# Patient Record
Sex: Male | Born: 1955
Health system: Southern US, Community
[De-identification: ages and names within clinical notes are randomized; demographics above are authoritative.]

## PROBLEM LIST (undated history)

## (undated) DIAGNOSIS — S62102A Fracture of unspecified carpal bone, left wrist, initial encounter for closed fracture: Secondary | ICD-10-CM

## (undated) DIAGNOSIS — F4310 Post-traumatic stress disorder, unspecified: Secondary | ICD-10-CM

## (undated) DIAGNOSIS — I10 Essential (primary) hypertension: Secondary | ICD-10-CM

## (undated) DIAGNOSIS — H547 Unspecified visual loss: Secondary | ICD-10-CM

## (undated) DIAGNOSIS — K219 Gastro-esophageal reflux disease without esophagitis: Secondary | ICD-10-CM

## (undated) DIAGNOSIS — F40231 Fear of injections and transfusions: Secondary | ICD-10-CM

## (undated) HISTORY — PX: OTHER SURGICAL HISTORY: SHX169

---

## 2007-11-01 ENCOUNTER — Encounter: Admission: RE | Admit: 2007-11-01 | Discharge: 2007-11-01 | Payer: Self-pay | Admitting: Internal Medicine

## 2008-08-12 IMAGING — CR DG FOOT COMPLETE 3+V*R*
3 series · 3 of 3 positions shown · non-contrast
Comparison: None available

CLINICAL DATA: Status post fall 2 months ago.

RIGHT FOOT COMPLETE - 3+ VIEW

[view not recorded (1 of 3)]
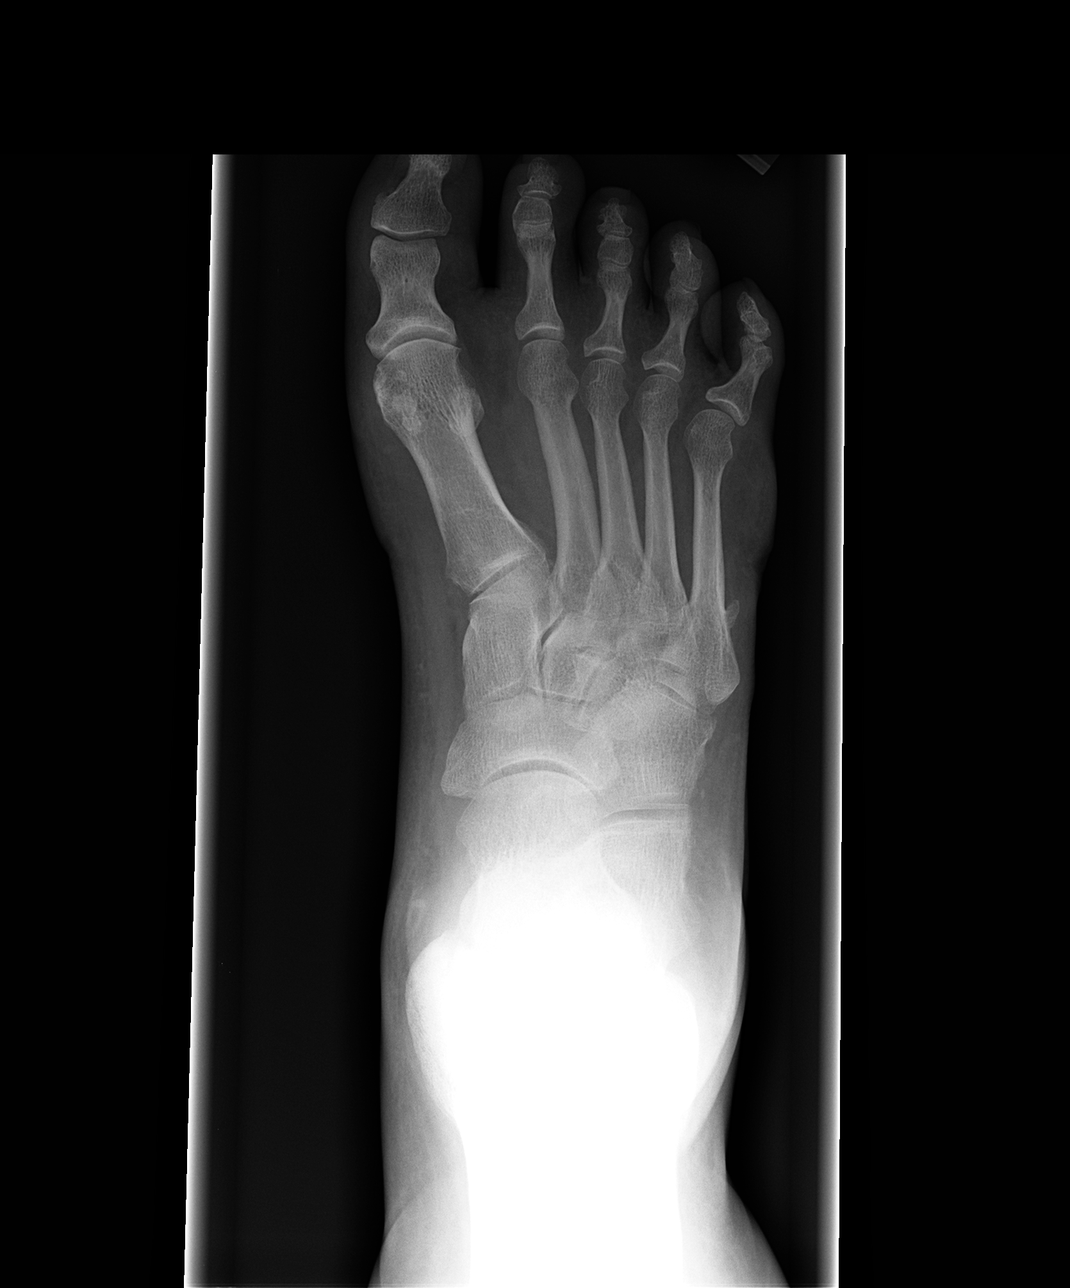

[view not recorded (2 of 3)]
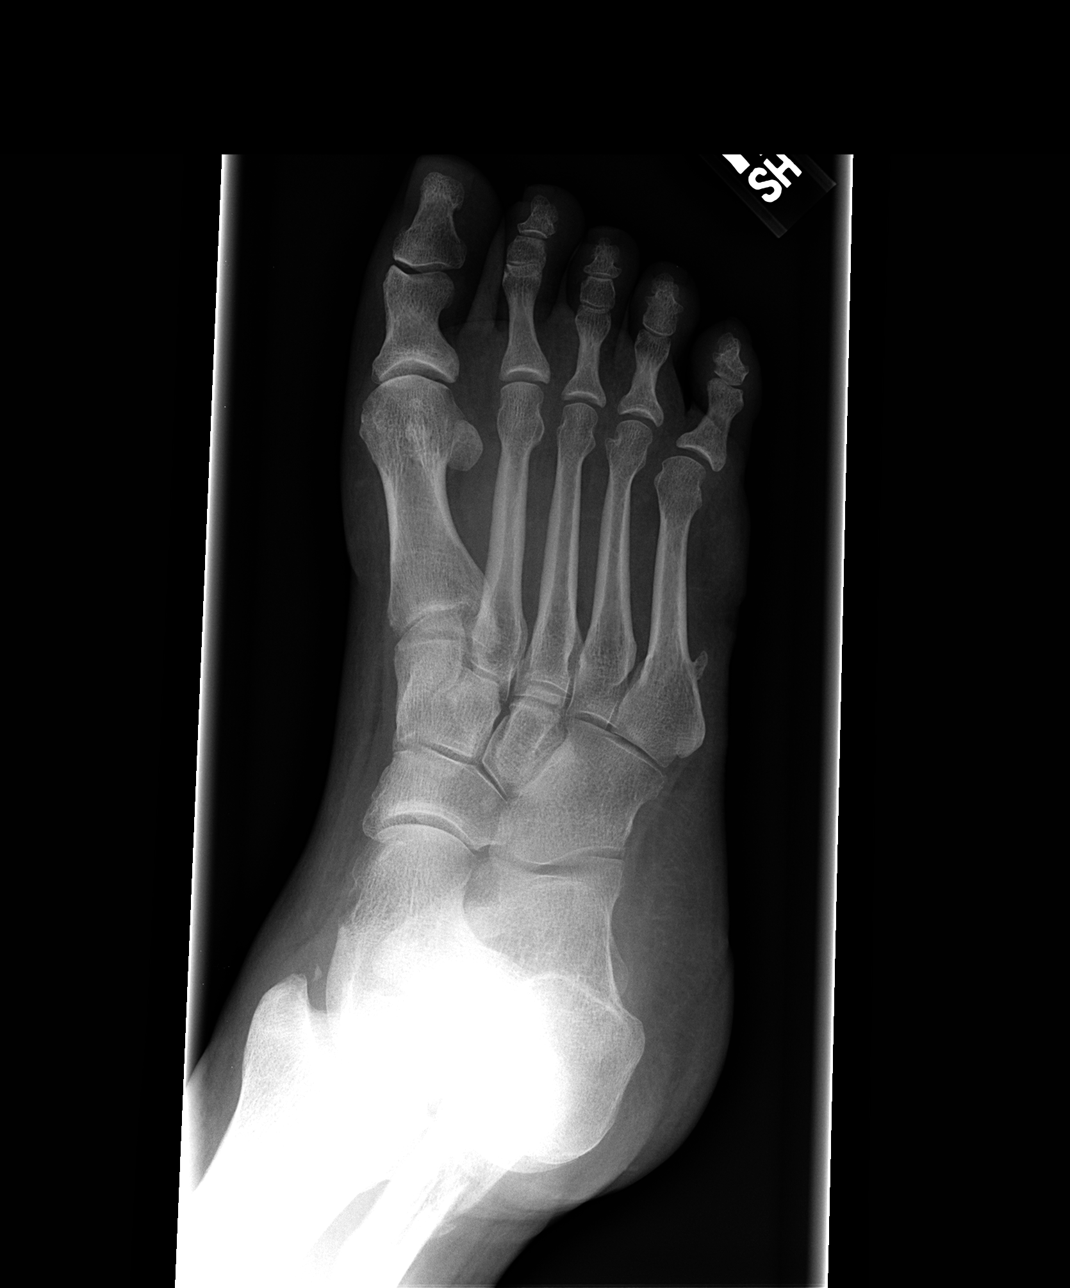

[view not recorded (3 of 3)]
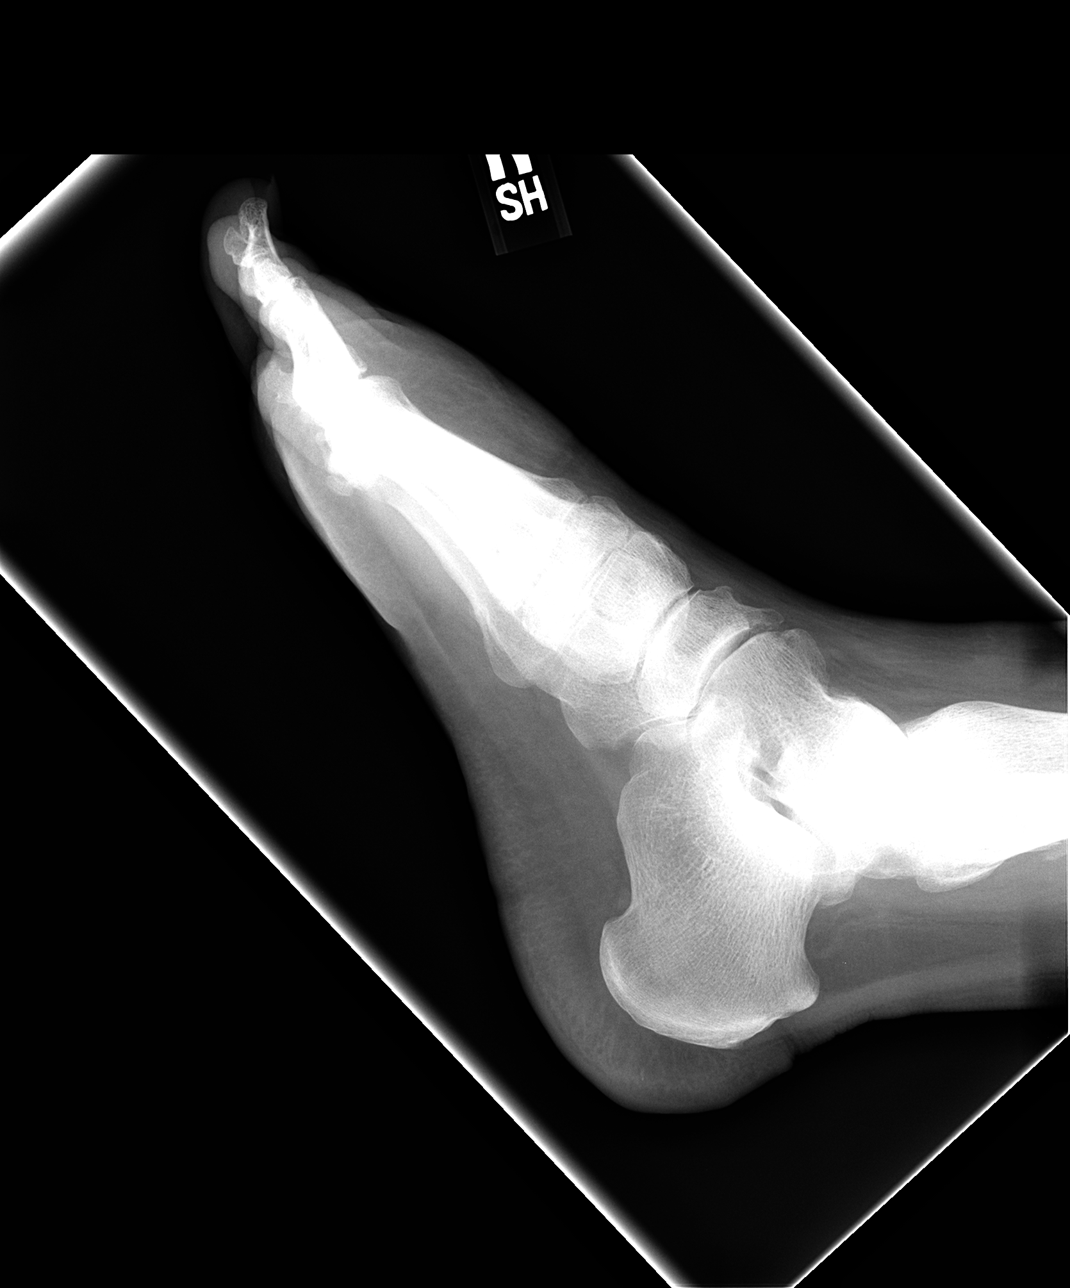

[3 of 3 positions shown; findings below may reference images not displayed]

FINDINGS: There is significant soft tissue swelling overlying the
MTP joints.  There is no underlying fracture.  A lateral of these a
fight is seen at the base of the fifth metatarsal.  There is a
small avulsion fracture at the medial malleolus.  An oblique
fibular fracture is redemonstrated.
IMPRESSION: 1.  Soft tissue swelling over the MTP joints without underlying
fracture.
2.  Oblique comminuted distal fibular fracture.  Please see report
of ankle films same date.
3.  Tiny avulsion fracture at the medial malleolus.

## 2008-08-12 IMAGING — CR DG ANKLE COMPLETE 3+V*R*
3 series · 3 of 3 positions shown · non-contrast
Comparison: None.

CLINICAL DATA: Status post fall 2 months ago.  Lateral ankle pain.

RIGHT ANKLE - COMPLETE 3+ VIEW

[view not recorded (1 of 3)]
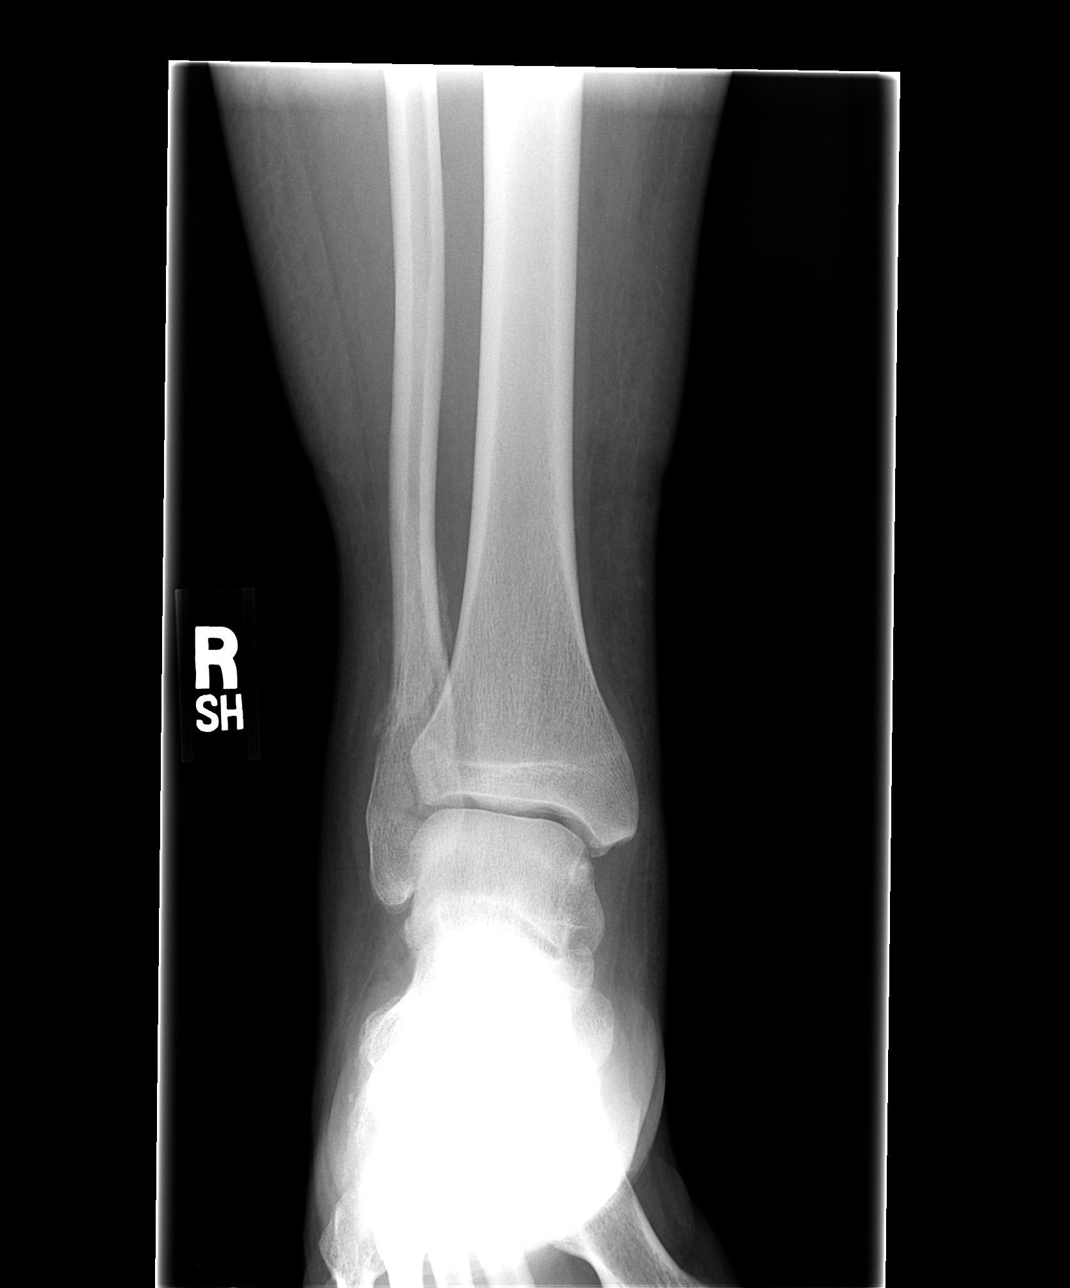

[view not recorded (2 of 3)]
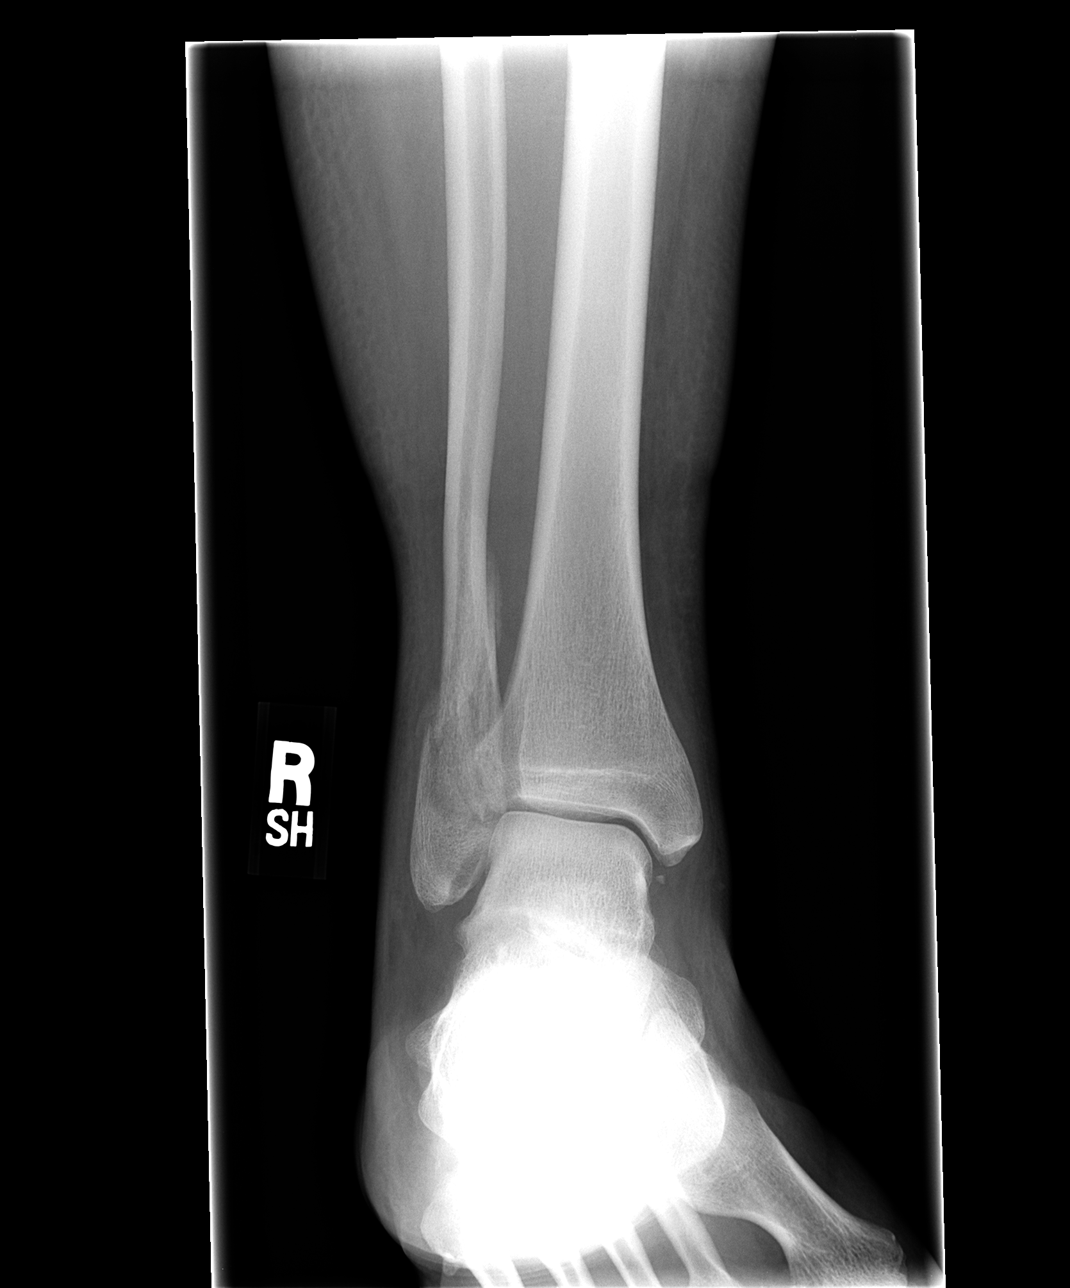

[view not recorded (3 of 3)]
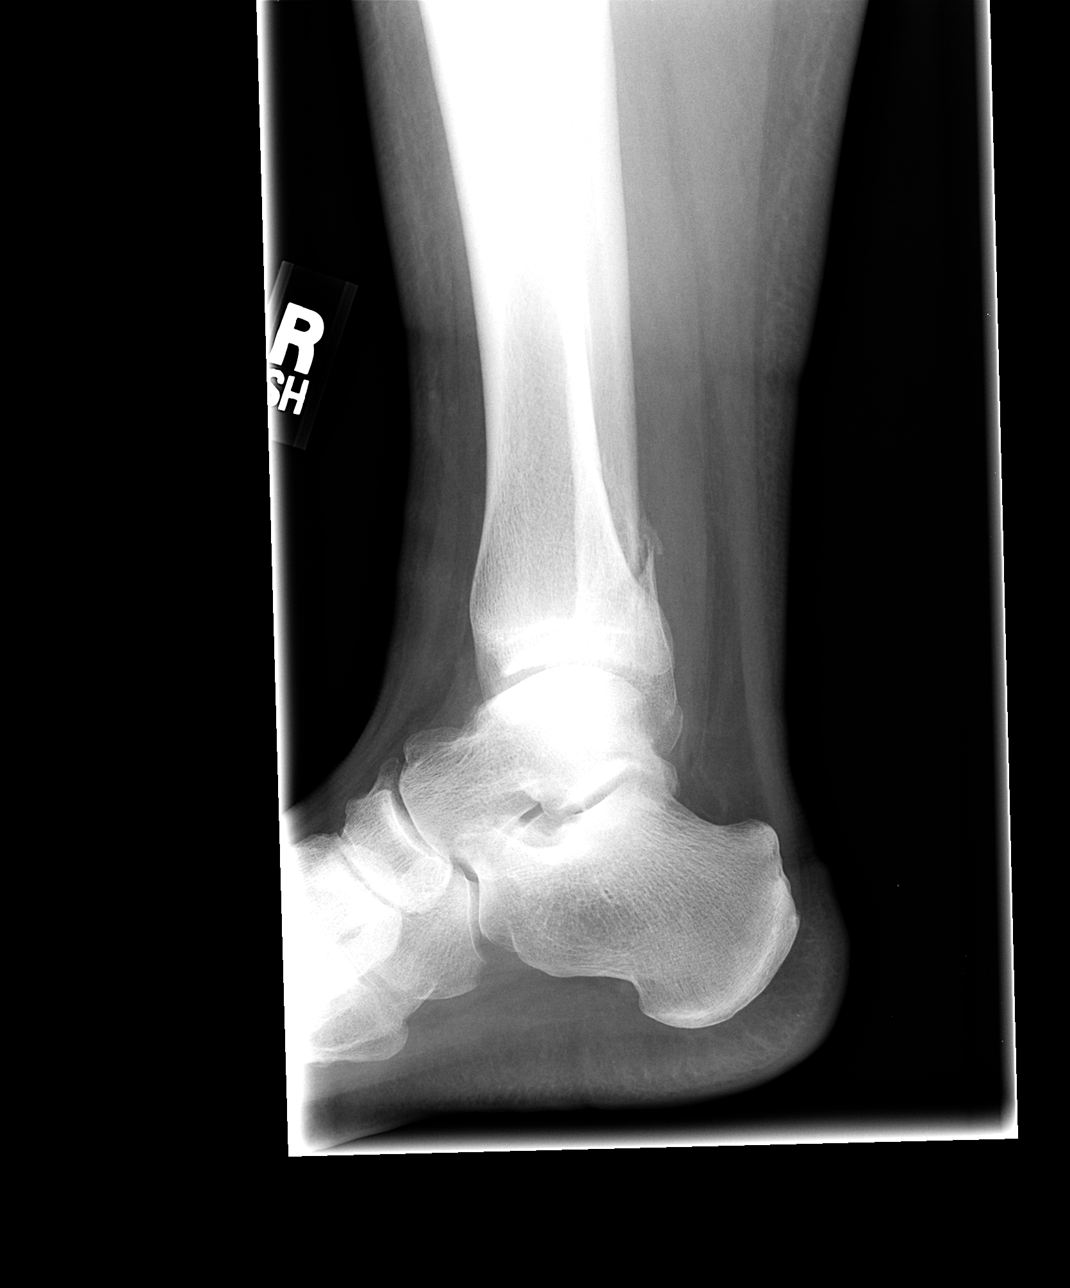

[3 of 3 positions shown; findings below may reference images not displayed]

FINDINGS: An oblique distal fibular fracture is displaced laterally
and posteriorly by 2-3 mm.  There is persistent soft tissue
swelling over the lateral malleolus.  Callus formation can be seen
about the fracture.  A punctate calcific density is present at the
medial malleolus.  This appears represent a tiny avulsion fracture,
better appreciated on the foot films of the same day.  No other
fractures are seen.
IMPRESSION: 1.  Healing oblique fracture of the distal fibula as described.
2.  Tiny avulsion fracture at the medial malleolus.

## 2015-08-26 DIAGNOSIS — H540 Blindness, both eyes: Secondary | ICD-10-CM | POA: Diagnosis not present

## 2015-08-26 DIAGNOSIS — K029 Dental caries, unspecified: Secondary | ICD-10-CM | POA: Diagnosis not present

## 2015-08-26 DIAGNOSIS — Z1389 Encounter for screening for other disorder: Secondary | ICD-10-CM | POA: Diagnosis not present

## 2015-08-26 DIAGNOSIS — Z0000 Encounter for general adult medical examination without abnormal findings: Secondary | ICD-10-CM | POA: Diagnosis not present

## 2015-08-26 DIAGNOSIS — I10 Essential (primary) hypertension: Secondary | ICD-10-CM | POA: Diagnosis not present

## 2015-08-26 DIAGNOSIS — E663 Overweight: Secondary | ICD-10-CM | POA: Diagnosis not present

## 2015-08-26 DIAGNOSIS — H3552 Pigmentary retinal dystrophy: Secondary | ICD-10-CM | POA: Diagnosis not present

## 2015-08-26 DIAGNOSIS — Z6833 Body mass index (BMI) 33.0-33.9, adult: Secondary | ICD-10-CM | POA: Diagnosis not present

## 2015-08-26 DIAGNOSIS — E784 Other hyperlipidemia: Secondary | ICD-10-CM | POA: Diagnosis not present

## 2015-08-26 DIAGNOSIS — N401 Enlarged prostate with lower urinary tract symptoms: Secondary | ICD-10-CM | POA: Diagnosis not present

## 2015-09-06 DIAGNOSIS — Z6833 Body mass index (BMI) 33.0-33.9, adult: Secondary | ICD-10-CM | POA: Diagnosis not present

## 2015-09-06 DIAGNOSIS — I10 Essential (primary) hypertension: Secondary | ICD-10-CM | POA: Diagnosis not present

## 2016-08-29 DIAGNOSIS — I10 Essential (primary) hypertension: Secondary | ICD-10-CM | POA: Diagnosis not present

## 2016-08-29 DIAGNOSIS — E663 Overweight: Secondary | ICD-10-CM | POA: Diagnosis not present

## 2016-08-29 DIAGNOSIS — E784 Other hyperlipidemia: Secondary | ICD-10-CM | POA: Diagnosis not present

## 2016-08-29 DIAGNOSIS — Z0000 Encounter for general adult medical examination without abnormal findings: Secondary | ICD-10-CM | POA: Diagnosis not present

## 2016-08-29 DIAGNOSIS — Z1389 Encounter for screening for other disorder: Secondary | ICD-10-CM | POA: Diagnosis not present

## 2016-08-29 DIAGNOSIS — N401 Enlarged prostate with lower urinary tract symptoms: Secondary | ICD-10-CM | POA: Diagnosis not present

## 2016-08-29 DIAGNOSIS — H3552 Pigmentary retinal dystrophy: Secondary | ICD-10-CM | POA: Diagnosis not present

## 2016-08-29 DIAGNOSIS — K027 Dental root caries: Secondary | ICD-10-CM | POA: Diagnosis not present

## 2016-08-29 DIAGNOSIS — Z6831 Body mass index (BMI) 31.0-31.9, adult: Secondary | ICD-10-CM | POA: Diagnosis not present

## 2017-08-30 DIAGNOSIS — Z0000 Encounter for general adult medical examination without abnormal findings: Secondary | ICD-10-CM | POA: Diagnosis not present

## 2017-08-30 DIAGNOSIS — H3552 Pigmentary retinal dystrophy: Secondary | ICD-10-CM | POA: Diagnosis not present

## 2017-08-30 DIAGNOSIS — K029 Dental caries, unspecified: Secondary | ICD-10-CM | POA: Diagnosis not present

## 2017-08-30 DIAGNOSIS — N401 Enlarged prostate with lower urinary tract symptoms: Secondary | ICD-10-CM | POA: Diagnosis not present

## 2017-08-30 DIAGNOSIS — E785 Hyperlipidemia, unspecified: Secondary | ICD-10-CM | POA: Diagnosis not present

## 2017-08-30 DIAGNOSIS — I10 Essential (primary) hypertension: Secondary | ICD-10-CM | POA: Diagnosis not present

## 2017-08-30 DIAGNOSIS — Z1389 Encounter for screening for other disorder: Secondary | ICD-10-CM | POA: Diagnosis not present

## 2017-08-30 DIAGNOSIS — E663 Overweight: Secondary | ICD-10-CM | POA: Diagnosis not present

## 2017-08-30 DIAGNOSIS — Z6830 Body mass index (BMI) 30.0-30.9, adult: Secondary | ICD-10-CM | POA: Diagnosis not present

## 2018-09-05 DIAGNOSIS — N401 Enlarged prostate with lower urinary tract symptoms: Secondary | ICD-10-CM | POA: Diagnosis not present

## 2018-09-05 DIAGNOSIS — K029 Dental caries, unspecified: Secondary | ICD-10-CM | POA: Diagnosis not present

## 2018-09-05 DIAGNOSIS — Z0000 Encounter for general adult medical examination without abnormal findings: Secondary | ICD-10-CM | POA: Diagnosis not present

## 2018-09-05 DIAGNOSIS — H3552 Pigmentary retinal dystrophy: Secondary | ICD-10-CM | POA: Diagnosis not present

## 2018-09-05 DIAGNOSIS — I10 Essential (primary) hypertension: Secondary | ICD-10-CM | POA: Diagnosis not present

## 2018-09-05 DIAGNOSIS — H543 Unqualified visual loss, both eyes: Secondary | ICD-10-CM | POA: Diagnosis not present

## 2018-09-05 DIAGNOSIS — E785 Hyperlipidemia, unspecified: Secondary | ICD-10-CM | POA: Diagnosis not present

## 2019-09-08 DIAGNOSIS — Z0000 Encounter for general adult medical examination without abnormal findings: Secondary | ICD-10-CM | POA: Diagnosis not present

## 2019-09-08 DIAGNOSIS — K029 Dental caries, unspecified: Secondary | ICD-10-CM | POA: Diagnosis not present

## 2019-09-08 DIAGNOSIS — E663 Overweight: Secondary | ICD-10-CM | POA: Diagnosis not present

## 2019-09-08 DIAGNOSIS — I10 Essential (primary) hypertension: Secondary | ICD-10-CM | POA: Diagnosis not present

## 2019-09-08 DIAGNOSIS — E785 Hyperlipidemia, unspecified: Secondary | ICD-10-CM | POA: Diagnosis not present

## 2019-09-08 DIAGNOSIS — H543 Unqualified visual loss, both eyes: Secondary | ICD-10-CM | POA: Diagnosis not present

## 2019-09-08 DIAGNOSIS — Z1331 Encounter for screening for depression: Secondary | ICD-10-CM | POA: Diagnosis not present

## 2019-09-08 DIAGNOSIS — H540X55 Blindness right eye category 5, blindness left eye category 5: Secondary | ICD-10-CM | POA: Diagnosis not present

## 2019-09-08 DIAGNOSIS — H3552 Pigmentary retinal dystrophy: Secondary | ICD-10-CM | POA: Diagnosis not present

## 2019-09-08 DIAGNOSIS — N401 Enlarged prostate with lower urinary tract symptoms: Secondary | ICD-10-CM | POA: Diagnosis not present

## 2019-09-17 DIAGNOSIS — Z23 Encounter for immunization: Secondary | ICD-10-CM | POA: Diagnosis not present

## 2019-11-04 DIAGNOSIS — H6123 Impacted cerumen, bilateral: Secondary | ICD-10-CM | POA: Diagnosis not present

## 2019-11-04 DIAGNOSIS — H543 Unqualified visual loss, both eyes: Secondary | ICD-10-CM | POA: Diagnosis not present

## 2019-11-07 DIAGNOSIS — H903 Sensorineural hearing loss, bilateral: Secondary | ICD-10-CM | POA: Diagnosis not present

## 2019-11-07 DIAGNOSIS — H6123 Impacted cerumen, bilateral: Secondary | ICD-10-CM | POA: Diagnosis not present

## 2019-11-28 ENCOUNTER — Other Ambulatory Visit: Payer: Self-pay

## 2019-11-28 ENCOUNTER — Encounter (HOSPITAL_BASED_OUTPATIENT_CLINIC_OR_DEPARTMENT_OTHER): Payer: Self-pay | Admitting: Orthopaedic Surgery

## 2019-11-28 ENCOUNTER — Encounter: Payer: Self-pay | Admitting: Family

## 2019-11-28 ENCOUNTER — Ambulatory Visit (INDEPENDENT_AMBULATORY_CARE_PROVIDER_SITE_OTHER): Payer: PPO

## 2019-11-28 ENCOUNTER — Ambulatory Visit: Payer: PPO | Admitting: Orthopaedic Surgery

## 2019-11-28 ENCOUNTER — Other Ambulatory Visit (HOSPITAL_COMMUNITY)
Admission: RE | Admit: 2019-11-28 | Discharge: 2019-11-28 | Disposition: A | Discharge status: Home / Self Care | Payer: PPO | Source: Ambulatory Visit | Attending: Orthopaedic Surgery | Admitting: Orthopaedic Surgery

## 2019-11-28 ENCOUNTER — Encounter (HOSPITAL_BASED_OUTPATIENT_CLINIC_OR_DEPARTMENT_OTHER)
Admission: RE | Admit: 2019-11-28 | Discharge: 2019-11-28 | Disposition: A | Discharge status: Home / Self Care | Payer: PPO | Source: Ambulatory Visit | Attending: Orthopaedic Surgery | Admitting: Orthopaedic Surgery

## 2019-11-28 VITALS — Ht 70.5 in | Wt 205.0 lb

## 2019-11-28 DIAGNOSIS — Z20822 Contact with and (suspected) exposure to covid-19: Secondary | ICD-10-CM | POA: Insufficient documentation

## 2019-11-28 DIAGNOSIS — Z01818 Encounter for other preprocedural examination: Secondary | ICD-10-CM | POA: Insufficient documentation

## 2019-11-28 DIAGNOSIS — S52532A Colles' fracture of left radius, initial encounter for closed fracture: Secondary | ICD-10-CM | POA: Diagnosis not present

## 2019-11-28 LAB — SARS CORONAVIRUS 2 (TAT 6-24 HRS): SARS Coronavirus 2: NEGATIVE

## 2019-11-28 NOTE — Progress Notes (Signed)
Office Visit Note   Patient: Kevin Montoya           Date of Birth: 01-03-1956           MRN: [id number] Visit Date: 11/28/2019              Requested by: No referring provider defined for this encounter. PCP: Patient, No Pcp Per   Assessment & Plan: Visit Diagnoses:  1. Closed Colles' fracture of left radius, initial encounter     Plan: Impression is intra-articular comminuted left distal radius fracture with dorsal angulation and radial height loss.  These x-rays were reviewed with the patient and his family member today and my recommendation is for surgical repair in the next few days.  I have also recommended carpal tunnel release as well.  Risk benefits rehab recovery reviewed in detail with the patient and his family member and all questions were answered to their satisfaction.  He was placed in a removable wrist brace today.  Plan is for surgery on Monday as an outpatient.  Follow-Up Instructions: Return for 2 week postop visit.   Orders:  Orders Placed This Encounter  Procedures  . XR Wrist 2 Views Left   No orders of the defined types were placed in this encounter.     Procedures: No procedures performed   Clinical Data: No additional findings.   Subjective: Chief Complaint  Patient presents with  . Left Wrist - Pain, New Patient (Initial Visit)    Kevin Montoya is a 64 year old gentleman who is blind who is brought here today by his family member for evaluation of left wrist injury that he sustained this past Monday from a mechanical fall.  He has a swelling and bruising and he is right-hand dominant.  Due to lack of improvement he is here for formal evaluation.  Denies any numbness and tingling.   Review of Systems  Constitutional: Negative.   All other systems reviewed and are negative.    Objective: Vital Signs: Ht 5' 10.5" (1.791 m)   Wt 205 lb (93 kg)   BMI 29.00 kg/m   Physical Exam Vitals and nursing note reviewed.  Constitutional:       Appearance: He is well-developed.  HENT:     Head: Normocephalic and atraumatic.  Eyes:     Pupils: Pupils are equal, round, and reactive to light.  Pulmonary:     Effort: Pulmonary effort is normal.  Abdominal:     Palpations: Abdomen is soft.  Musculoskeletal:        General: Normal range of motion.     Cervical back: Neck supple.  Skin:    General: Skin is warm.  Neurological:     Mental Status: He is alert and oriented to person, place, and time.  Psychiatric:        Behavior: Behavior normal.        Thought Content: Thought content normal.        Judgment: Judgment normal.     Ortho Exam Left wrist shows moderate bruising and mild swelling.  Strong radial pulse.  Neurovascular intact distally. Specialty Comments:  No specialty comments available.  Imaging: XR Wrist 2 Views Left  Result Date: 11/28/2019 Comminuted intraarticular distal radius fracture with dorsal angulation, radial height loss.    PMFS History: Patient Active Problem List   Diagnosis Date Noted  . Fracture, Colles, left, closed 11/28/2019   No past medical history on file.  No family history on file.   Social History  Occupational History  . Not on file  Tobacco Use  . Smoking status: Never Smoker  . Smokeless tobacco: Never Used  Substance and Sexual Activity  . Alcohol use: Never  . Drug use: Never  . Sexual activity: Not on file

## 2019-12-01 ENCOUNTER — Other Ambulatory Visit: Payer: Self-pay

## 2019-12-01 ENCOUNTER — Ambulatory Visit (HOSPITAL_BASED_OUTPATIENT_CLINIC_OR_DEPARTMENT_OTHER): Payer: PPO | Admitting: Anesthesiology

## 2019-12-01 ENCOUNTER — Encounter (HOSPITAL_BASED_OUTPATIENT_CLINIC_OR_DEPARTMENT_OTHER): Payer: Self-pay | Admitting: Orthopaedic Surgery

## 2019-12-01 ENCOUNTER — Encounter (HOSPITAL_BASED_OUTPATIENT_CLINIC_OR_DEPARTMENT_OTHER)
Admission: RE | Disposition: A | Discharge status: Home / Self Care | Payer: Self-pay | Source: Home / Self Care | Attending: Orthopaedic Surgery

## 2019-12-01 ENCOUNTER — Ambulatory Visit (HOSPITAL_BASED_OUTPATIENT_CLINIC_OR_DEPARTMENT_OTHER)
Admission: RE | Admit: 2019-12-01 | Discharge: 2019-12-01 | Disposition: A | Discharge status: Home / Self Care | Payer: PPO | Attending: Orthopaedic Surgery | Admitting: Orthopaedic Surgery

## 2019-12-01 DIAGNOSIS — G5602 Carpal tunnel syndrome, left upper limb: Secondary | ICD-10-CM | POA: Insufficient documentation

## 2019-12-01 DIAGNOSIS — Z79899 Other long term (current) drug therapy: Secondary | ICD-10-CM | POA: Diagnosis not present

## 2019-12-01 DIAGNOSIS — S52502A Unspecified fracture of the lower end of left radius, initial encounter for closed fracture: Secondary | ICD-10-CM | POA: Diagnosis not present

## 2019-12-01 DIAGNOSIS — S52572A Other intraarticular fracture of lower end of left radius, initial encounter for closed fracture: Secondary | ICD-10-CM | POA: Insufficient documentation

## 2019-12-01 DIAGNOSIS — K219 Gastro-esophageal reflux disease without esophagitis: Secondary | ICD-10-CM | POA: Insufficient documentation

## 2019-12-01 DIAGNOSIS — S52512A Displaced fracture of left radial styloid process, initial encounter for closed fracture: Secondary | ICD-10-CM | POA: Diagnosis not present

## 2019-12-01 DIAGNOSIS — S52532A Colles' fracture of left radius, initial encounter for closed fracture: Secondary | ICD-10-CM

## 2019-12-01 DIAGNOSIS — X58XXXA Exposure to other specified factors, initial encounter: Secondary | ICD-10-CM | POA: Insufficient documentation

## 2019-12-01 DIAGNOSIS — I10 Essential (primary) hypertension: Secondary | ICD-10-CM | POA: Diagnosis not present

## 2019-12-01 DIAGNOSIS — G8918 Other acute postprocedural pain: Secondary | ICD-10-CM | POA: Diagnosis not present

## 2019-12-01 DIAGNOSIS — S62122A Displaced fracture of lunate [semilunar], left wrist, initial encounter for closed fracture: Secondary | ICD-10-CM | POA: Insufficient documentation

## 2019-12-01 HISTORY — DX: Essential (primary) hypertension: I10

## 2019-12-01 HISTORY — DX: Fracture of unspecified carpal bone, left wrist, initial encounter for closed fracture: S62.102A

## 2019-12-01 HISTORY — PX: CARPAL TUNNEL RELEASE: SHX101

## 2019-12-01 HISTORY — DX: Gastro-esophageal reflux disease without esophagitis: K21.9

## 2019-12-01 HISTORY — PX: OPEN REDUCTION INTERNAL FIXATION (ORIF) DISTAL RADIAL FRACTURE: SHX5989

## 2019-12-01 HISTORY — DX: Fear of injections and transfusions: F40.231

## 2019-12-01 SURGERY — OPEN REDUCTION INTERNAL FIXATION (ORIF) DISTAL RADIUS FRACTURE
Anesthesia: General | Laterality: Left

## 2019-12-01 MED ORDER — MEPERIDINE HCL 25 MG/ML IJ SOLN: 6.2500 mg | INTRAMUSCULAR | Status: DC | PRN

## 2019-12-01 MED ORDER — ACETAMINOPHEN 160 MG/5ML PO SOLN: 325.0000 mg | ORAL | Status: DC | PRN

## 2019-12-01 MED ORDER — ACETAMINOPHEN 325 MG PO TABS: 325.0000 mg | ORAL_TABLET | ORAL | Status: DC | PRN

## 2019-12-01 MED ORDER — PROPOFOL 500 MG/50ML IV EMUL
INTRAVENOUS | Status: DC | PRN
  Administered 2019-12-01: 25 ug/kg/min via INTRAVENOUS

## 2019-12-01 MED ORDER — LIDOCAINE HCL (CARDIAC) PF 100 MG/5ML IV SOSY
PREFILLED_SYRINGE | INTRAVENOUS | Status: DC | PRN
  Administered 2019-12-01: 30 mg via INTRAVENOUS

## 2019-12-01 MED ORDER — PROPOFOL 10 MG/ML IV BOLUS
INTRAVENOUS | Status: DC | PRN
  Administered 2019-12-01: 200 mg via INTRAVENOUS

## 2019-12-01 MED ORDER — MIDAZOLAM HCL 2 MG/ML PO SYRP
ORAL_SOLUTION | ORAL | Status: AC
  Filled 2019-12-01: qty 5

## 2019-12-01 MED ORDER — FLUMAZENIL 0.5 MG/5ML IV SOLN
0.2000 mg | Freq: Once | INTRAVENOUS | Status: AC
  Administered 2019-12-01: 0.2 mg via INTRAVENOUS

## 2019-12-01 MED ORDER — FENTANYL CITRATE (PF) 100 MCG/2ML IJ SOLN
INTRAMUSCULAR | Status: AC
  Filled 2019-12-01: qty 2

## 2019-12-01 MED ORDER — LACTATED RINGERS IV SOLN: INTRAVENOUS | Status: DC

## 2019-12-01 MED ORDER — ONDANSETRON HCL 4 MG/2ML IJ SOLN: 4.0000 mg | Freq: Once | INTRAMUSCULAR | Status: DC | PRN

## 2019-12-01 MED ORDER — MIDAZOLAM HCL 2 MG/ML PO SYRP
10.0000 mg | ORAL_SOLUTION | Freq: Once | ORAL | Status: AC
  Administered 2019-12-01: 10 mg via ORAL

## 2019-12-01 MED ORDER — OXYCODONE HCL 5 MG PO TABS: 5.0000 mg | ORAL_TABLET | Freq: Once | ORAL | Status: DC | PRN

## 2019-12-01 MED ORDER — FLUMAZENIL 0.5 MG/5ML IV SOLN
INTRAVENOUS | Status: AC
  Filled 2019-12-01: qty 5

## 2019-12-01 MED ORDER — DEXAMETHASONE SODIUM PHOSPHATE 10 MG/ML IJ SOLN
INTRAMUSCULAR | Status: DC | PRN
  Administered 2019-12-01: 10 mg
  Administered 2019-12-01: 4 mg

## 2019-12-01 MED ORDER — CEFAZOLIN SODIUM-DEXTROSE 2-4 GM/100ML-% IV SOLN
INTRAVENOUS | Status: AC
  Filled 2019-12-01: qty 100

## 2019-12-01 MED ORDER — FENTANYL CITRATE (PF) 100 MCG/2ML IJ SOLN
100.0000 ug | Freq: Once | INTRAMUSCULAR | Status: AC
Start: 2019-12-01 — End: 2019-12-01
  Administered 2019-12-01: 50 ug via INTRAVENOUS

## 2019-12-01 MED ORDER — FENTANYL CITRATE (PF) 100 MCG/2ML IJ SOLN: 25.0000 ug | INTRAMUSCULAR | Status: DC | PRN

## 2019-12-01 MED ORDER — ONDANSETRON HCL 4 MG PO TABS: 4.0000 mg | ORAL_TABLET | Freq: Three times a day (TID) | ORAL | 0 refills | Status: DC | PRN

## 2019-12-01 MED ORDER — VITAMIN C 500 MG PO CHEW
500.0000 mg | CHEWABLE_TABLET | Freq: Two times a day (BID) | ORAL | 0 refills | Status: AC
Start: 2019-12-01 — End: ?

## 2019-12-01 MED ORDER — OXYCODONE HCL 5 MG/5ML PO SOLN: 5.0000 mg | Freq: Once | ORAL | Status: DC | PRN

## 2019-12-01 MED ORDER — ONDANSETRON HCL 4 MG/2ML IJ SOLN
INTRAMUSCULAR | Status: DC | PRN
  Administered 2019-12-01: 4 mg via INTRAVENOUS

## 2019-12-01 MED ORDER — BUPIVACAINE-EPINEPHRINE (PF) 0.5% -1:200000 IJ SOLN
INTRAMUSCULAR | Status: DC | PRN
  Administered 2019-12-01: 15 mL

## 2019-12-01 MED ORDER — CEFAZOLIN SODIUM-DEXTROSE 2-4 GM/100ML-% IV SOLN: 2.0000 g | INTRAVENOUS | Status: DC

## 2019-12-01 MED ORDER — OXYCODONE-ACETAMINOPHEN 5-325 MG PO TABS: 1.0000 | ORAL_TABLET | Freq: Three times a day (TID) | ORAL | 0 refills | Status: DC | PRN

## 2019-12-01 SURGICAL SUPPLY — 73 items
BAND RUBBER #18 3X1/16 STRL (MISCELLANEOUS) ×6 IMPLANT
BIT DRILL 2.2 SS TIBIAL (BIT) ×3 IMPLANT
BLADE MINI RND TIP GREEN BEAV (BLADE) ×3 IMPLANT
BLADE SURG 15 STRL LF DISP TIS (BLADE) ×2 IMPLANT
BLADE SURG 15 STRL SS (BLADE) ×4
BNDG ELASTIC 3X5.8 VLCR STR LF (GAUZE/BANDAGES/DRESSINGS) ×3 IMPLANT
BNDG ESMARK 4X9 LF (GAUZE/BANDAGES/DRESSINGS) ×3 IMPLANT
BRUSH SCRUB EZ PLAIN DRY (MISCELLANEOUS) ×3 IMPLANT
CORD BIPOLAR FORCEPS 12FT (ELECTRODE) ×3 IMPLANT
COVER BACK TABLE 60X90IN (DRAPES) ×3 IMPLANT
COVER MAYO STAND STRL (DRAPES) ×3 IMPLANT
CUFF TOURN SGL QUICK 18X4 (TOURNIQUET CUFF) ×3 IMPLANT
DRAPE EXTREMITY T 121X128X90 (DISPOSABLE) ×3 IMPLANT
DRAPE HALF SHEET 70X43 (DRAPES) ×3 IMPLANT
DRAPE IMP U-DRAPE 54X76 (DRAPES) ×3 IMPLANT
DRAPE OEC MINIVIEW 54X84 (DRAPES) ×3 IMPLANT
DRAPE SURG 17X23 STRL (DRAPES) ×3 IMPLANT
GAUZE SPONGE 4X4 12PLY STRL (GAUZE/BANDAGES/DRESSINGS) ×3 IMPLANT
GAUZE XEROFORM 1X8 LF (GAUZE/BANDAGES/DRESSINGS) ×3 IMPLANT
GLOVE BIOGEL PI IND STRL 7.0 (GLOVE) ×1 IMPLANT
GLOVE BIOGEL PI INDICATOR 7.0 (GLOVE) ×2
GLOVE ECLIPSE 7.0 STRL STRAW (GLOVE) ×3 IMPLANT
GLOVE SKINSENSE NS SZ7.5 (GLOVE) ×2
GLOVE SKINSENSE STRL SZ7.5 (GLOVE) ×1 IMPLANT
GLOVE SURG SYN 7.5  E (GLOVE) ×2
GLOVE SURG SYN 7.5 E (GLOVE) ×1 IMPLANT
GOWN STRL REIN XL XLG (GOWN DISPOSABLE) ×3 IMPLANT
GOWN STRL REUS W/ TWL LRG LVL3 (GOWN DISPOSABLE) ×1 IMPLANT
GOWN STRL REUS W/ TWL XL LVL3 (GOWN DISPOSABLE) ×2 IMPLANT
GOWN STRL REUS W/TWL LRG LVL3 (GOWN DISPOSABLE) ×2
GOWN STRL REUS W/TWL XL LVL3 (GOWN DISPOSABLE) ×4
K-WIRE 1.6 (WIRE) ×6
K-WIRE FX5X1.6XNS BN SS (WIRE) ×3
KWIRE FX5X1.6XNS BN SS (WIRE) ×3 IMPLANT
NS IRRIG 1000ML POUR BTL (IV SOLUTION) ×3 IMPLANT
PACK BASIN DAY SURGERY FS (CUSTOM PROCEDURE TRAY) ×3 IMPLANT
PAD CAST 3X4 CTTN HI CHSV (CAST SUPPLIES) ×1 IMPLANT
PADDING CAST ABS 3INX4YD NS (CAST SUPPLIES) ×2
PADDING CAST ABS COTTON 3X4 (CAST SUPPLIES) ×1 IMPLANT
PADDING CAST COTTON 3X4 STRL (CAST SUPPLIES) ×2
PLATE STANDARD DVR LEFT (Plate) ×3 IMPLANT
PLATE STD DVR LT 24X51 (Plate) ×1 IMPLANT
SCREW LOCK 14X2.7X 3 LD TPR (Screw) ×1 IMPLANT
SCREW LOCK 16X2.7X 3 LD TPR (Screw) ×1 IMPLANT
SCREW LOCK 18X2.7X 3 LD TPR (Screw) ×3 IMPLANT
SCREW LOCK 20X2.7X 3 LD TPR (Screw) ×2 IMPLANT
SCREW LOCK 22X2.7X 3 LD TPR (Screw) ×1 IMPLANT
SCREW LOCKING 2.7X14 (Screw) ×2 IMPLANT
SCREW LOCKING 2.7X15MM (Screw) ×6 IMPLANT
SCREW LOCKING 2.7X16 (Screw) ×2 IMPLANT
SCREW LOCKING 2.7X18 (Screw) ×6 IMPLANT
SCREW LOCKING 2.7X20MM (Screw) ×4 IMPLANT
SCREW LOCKING 2.7X22MM (Screw) ×2 IMPLANT
SCREW LOCKING 2.7X8MM (Screw) ×3 IMPLANT
SCREW LP NL 2.7X22MM (Screw) ×3 IMPLANT
SCREW NONLOCK 2.7X18MM (Screw) ×3 IMPLANT
SLEEVE SCD COMPRESS KNEE MED (MISCELLANEOUS) ×3 IMPLANT
SLING ARM FOAM STRAP LRG (SOFTGOODS) ×3 IMPLANT
SPLINT FIBERGLASS 4X30 (CAST SUPPLIES) ×3 IMPLANT
SPONGE LAP 18X18 RF (DISPOSABLE) ×3 IMPLANT
STOCKINETTE 4X48 STRL (DRAPES) ×3 IMPLANT
SUCTION FRAZIER HANDLE 10FR (MISCELLANEOUS) ×2
SUCTION TUBE FRAZIER 10FR DISP (MISCELLANEOUS) ×1 IMPLANT
SUT ETHILON 3 0 PS 1 (SUTURE) ×3 IMPLANT
SUT ETHILON 4 0 PS 2 18 (SUTURE) ×3 IMPLANT
SUT VIC AB 3-0 PS1 18 (SUTURE) ×2
SUT VIC AB 3-0 PS1 18XBRD (SUTURE) ×1 IMPLANT
SYR BULB EAR ULCER 3OZ GRN STR (SYRINGE) ×3 IMPLANT
TOWEL GREEN STERILE FF (TOWEL DISPOSABLE) ×6 IMPLANT
TRAY DSU PREP LF (CUSTOM PROCEDURE TRAY) ×3 IMPLANT
TUBE CONNECTING 20'X1/4 (TUBING) ×1
TUBE CONNECTING 20X1/4 (TUBING) ×2 IMPLANT
UNDERPAD 30X36 HEAVY ABSORB (UNDERPADS AND DIAPERS) ×6 IMPLANT

## 2019-12-01 NOTE — H&P (Signed)
PREOPERATIVE H&P  Chief Complaint: left distal radius fracture, left carpal tunnel syndrome  HPI: Kevin Montoya is a 64 y.o. male who presents for surgical treatment of left distal radius fracture, left carpal tunnel syndrome.  He denies any changes in medical history.  Past Medical History:  Diagnosis Date   GERD (gastroesophageal reflux disease)    Hypertension    Left wrist fracture    Severe needle phobia    Past Surgical History:  Procedure Laterality Date   extra finger removal     as child   Social History   Socioeconomic History   Marital status: Married    Spouse name: Not on file   Number of children: Not on file   Years of education: Not on file   Highest education level: Not on file  Occupational History   Not on file  Tobacco Use   Smoking status: Never Smoker   Smokeless tobacco: Never Used  Substance and Sexual Activity   Alcohol use: Yes    Comment: occais   Drug use: Never   Sexual activity: Not on file  Other Topics Concern   Not on file  Social History Narrative   Not on file   Social Determinants of Health   Financial Resource Strain:    Difficulty of Paying Living Expenses:   Food Insecurity:    Worried About Programme researcher, broadcasting/film/video in the Last Year:    Barista in the Last Year:   Transportation Needs:    Freight forwarder (Medical):    Lack of Transportation (Non-Medical):   Physical Activity:    Days of Exercise per Week:    Minutes of Exercise per Session:   Stress:    Feeling of Stress :   Social Connections:    Frequency of Communication with Friends and Family:    Frequency of Social Gatherings with Friends and Family:    Attends Religious Services:    Active Member of Clubs or Organizations:    Attends Banker Meetings:    Marital Status:    History reviewed. No pertinent family history. Allergies  Allergen Reactions   Penicillins Rash   Prior to Admission  medications   Medication Sig Start Date End Date Taking? Authorizing Provider  amLODipine (NORVASC) 5 MG tablet Take 5 mg by mouth daily.   Yes [provider]  famotidine (PEPCID) 20 MG tablet Take 20 mg by mouth 2 (two) times daily.   Yes [provider]  Multiple Vitamin (MULTIVITAMIN WITH MINERALS) TABS tablet Take 1 tablet by mouth daily.   Yes [provider]  omeprazole (PRILOSEC) 20 MG capsule Take 20 mg by mouth daily.   Yes [provider]     Positive ROS: All other systems have been reviewed and were otherwise negative with the exception of those mentioned in the HPI and as above.  Physical Exam: General: Alert, no acute distress Cardiovascular: No pedal edema Respiratory: No cyanosis, no use of accessory musculature GI: abdomen soft Skin: No lesions in the area of chief complaint Neurologic: Sensation intact distally Psychiatric: Patient is competent for consent with normal mood and affect Lymphatic: no lymphedema  MUSCULOSKELETAL: exam stable  Assessment: left distal radius fracture, left carpal tunnel syndrome  Plan: Plan for Procedure(s): OPEN REDUCTION INTERNAL FIXATION (ORIF) LEFT DISTAL RADIUS FRACTURE CARPAL TUNNEL RELEASE  The risks benefits and alternatives were discussed with the patient including but not limited to the risks of nonoperative treatment,  versus surgical intervention including infection, bleeding, nerve injury,  blood clots, cardiopulmonary complications, morbidity, mortality, among others, and they were willing to proceed.   Preoperative templating of the joint replacement has been completed, documented, and submitted to the Operating Room personnel in order to optimize intra-operative equipment management.   Glee Arvin, MD 12/01/2019 6:14 AM

## 2019-12-01 NOTE — Anesthesia Procedure Notes (Signed)
Anesthesia Regional Block: Supraclavicular block   Pre-Anesthetic Checklist: ,, timeout performed, Correct Patient, Correct Site, Correct Laterality, Correct Procedure, Correct Position, site marked, Risks and benefits discussed,  Surgical consent,  Pre-op evaluation,  At surgeon's request and post-op pain management  Laterality: Left  Prep: chloraprep       Needles:  Injection technique: Single-shot  Needle Type: Echogenic Stimulator Needle     Needle Length: 5cm  Needle Gauge: 22     Additional Needles:   Procedures:, nerve stimulator,,, ultrasound used (permanent image in chart),,,,   Nerve Stimulator or Paresthesia:  Response: HAND, 0.45 mA,   Additional Responses:   Narrative:  Start time: 12/01/2019 1:30 PM End time: 12/01/2019 1:35 PM Injection made incrementally with aspirations every 5 mL.  Performed by: Personally  Anesthesiologist: Bethena Midget, MD  Additional Notes: Functioning IV was confirmed and monitors were applied.  A 34mm 22ga Arrow echogenic stimulator needle was used. Sterile prep and drape,hand hygiene and sterile gloves were used. Ultrasound guidance: relevant anatomy identified, needle position confirmed, local anesthetic spread visualized around nerve(s)., vascular puncture avoided.  Image printed for medical record. Negative aspiration and negative test dose prior to incremental administration of local anesthetic. The patient tolerated the procedure well.

## 2019-12-01 NOTE — Op Note (Addendum)
   DATE OF SURGERY: 12/01/2019  PREOPERATIVE DIAGNOSIS: left distal radius fracture  POSTOPERATIVE DIAGNOSIS: same  PROCEDURE:  1. Open reduction and internal fixation, Left CPT [phone] 3 or more fragments 2.  Left carpal tunnel release  IMPLANT:  Implant Name Type Inv. Item Serial No. Manufacturer Lot No. LRB No. Used Action  PLATE STANDARD DVR LEFT - [id number] Plate PLATE STANDARD DVR LEFT  [employer](ORTH,TRAU,BIO,SG)  Left 1 Implanted     SURGEON: Surgeon(s) and Role:    Tarry Kos, MD - Primary  ASSIST: Oneal Grout, PA-C; necessary for the timely completion of procedure and due to complexity of procedure.  ANESTHESIA: Regional  TOURNIQUET TIME: 70 mins  BLOOD LOSS: Minimal.  COMPLICATIONS: see description of procedure.  PATHOLOGY: None.  TIME OUT: Performed before the start of procedure.  INDICATIONS: The patient is a 64 y.o. male who presented with a displaced left distal radius fracture indicated for osteosynthesis.  DESCRIPTION OF PROCEDURE:  The patient was identified in the preoperative holding area.  The operative site was marked by the surgeon and confirmed by the patient.  He was brought back to the operating room.  Anesthesia was induced by the anesthesia team.  A well padded nonsterile tourniquet was placed. The operative extremity was prepped and draped in standard sterile fashion.  A timeout was performed.  Preoperative antibiotics were given. The extremity was exsanguinated, tourniquet inflated to 250 mmHg.  A FCR approach was used.  Dissection through the sheath of FCR was carried out and the FCR tendon was retracted. The floor of FCR sheath was released. Flexor tendons and median nerve were retracted ulnarly and protected. Pronator quadratus was released from distal radius. Fracture lines were visualized which showed comminution of the distal radius fracture in addition to the two main fragments of the radial styloid and lunate facet. Fracture  was reduced under fluoroscopy and clamped and pinned. A volar locking plate was applied to the volar surface of the distal radius. Locking distal locking screws and nonlocking shaft  screws were inserted. Fluoroscopy was used to show adequate reduction.  We then turned our attention to the carpal tunnel release.  Incision was made about 3 mm ulnar to the thenar crease.  Dissection was carried down through the subcutaneous tissue and the palmar fascia.  The roof of the transverse carpal ligament was identified and a mosquito was placed just deep to the transverse carpal ligament and a Beaver blade was used to sharply incised the transverse carpal ligament directly down onto the mosquito from a distal to proximal direction.  A subcutaneous tunnel was then created to place the sole retractor and the wrist was placed into flexion and the rest of the transverse carpal ligament as well as the antebrachial fascia were released under direct visualization.  The tourniquet was deflated. Hemostasis was achieved. The wound was irrigated. Incision closed in layers. Sterile dressing applied. Wrist immobilized in a removable brace. The patient was transferred to the recovery room in stable condition after all counts were correct.  POSTOPERATIVE PLAN: The patient will remain in the brace until instructed. Sutures will be removed in two weeks. About four weeks after surgery, will start wrist range of motion exercises, but in the meantime before then she will start aggressive finger range of motion exercises without resistance.  The "six pack of hand exercises" handout was given to the patient.

## 2019-12-01 NOTE — Transfer of Care (Signed)
Immediate Anesthesia Transfer of Care Note  Patient: Kevin Montoya  Procedure(s) Performed: OPEN REDUCTION INTERNAL FIXATION (ORIF) LEFT DISTAL RADIUS FRACTURE (Left ) CARPAL TUNNEL RELEASE (Left )  Patient Location: PACU  Anesthesia Type:GA combined with regional for post-op pain  Level of Consciousness: drowsy and patient cooperative  Airway & Oxygen Therapy: Patient Spontanous Breathing and Patient connected to face mask oxygen  Post-op Assessment: Report given to RN and Post -op Vital signs reviewed and stable  Post vital signs: Reviewed and stable  Last Vitals:  Vitals Value Taken Time  BP    Temp    Pulse 66 12/01/19 1640  Resp 12 12/01/19 1640  SpO2 94 % 12/01/19 1640  Vitals shown include unvalidated device data.  Last Pain:  Vitals:   12/01/19 1247  TempSrc: Oral  PainSc: 3          Complications: No complications documented.

## 2019-12-01 NOTE — Discharge Instructions (Signed)
Postoperative instructions:  Weightbearing instructions: non weight bearing  Dressing instructions: Keep your dressing and/or splint clean and dry at all times.  It will be removed at your first post-operative appointment.  Your stitches and/or staples will be removed at this visit.  Incision instructions:  Do not soak your incision for 3 weeks after surgery.  If the incision gets wet, pat dry and do not scrub the incision.  Pain control:  You have been given a prescription to be taken as directed for post-operative pain control.  In addition, elevate the operative extremity above the heart at all times to prevent swelling and throbbing pain.  Take over-the-counter Colace, 100mg  by mouth twice a day while taking narcotic pain medications to help prevent constipation.  Follow up appointments: 1) 14 days for suture removal and wound check. 2) Dr. as scheduled.   -------------------------------------------------------------------------------------------------------------  After Surgery Pain Control:  After your surgery, post-surgical discomfort or pain is likely. This discomfort can last several days to a few weeks. At certain times of the day your discomfort may be more intense.  Did you receive a nerve block?  A nerve block can provide pain relief for one hour to two days after your surgery. As long as the nerve block is working, you will experience little or no sensation in the area the surgeon operated on.  As the nerve block wears off, you will begin to experience pain or discomfort. It is very important that you begin taking your prescribed pain medication before the nerve block fully wears off. Treating your pain at the first sign of the block wearing off will ensure your pain is better controlled and more tolerable when full-sensation returns. Do not wait until the pain is intolerable, as the medicine will be less effective. It is better to treat pain in advance than to try and  catch up.  General Anesthesia:  If you did not receive a nerve block during your surgery, you will need to start taking your pain medication shortly after your surgery and should continue to do so as prescribed by your surgeon.  Pain Medication:  Most commonly we prescribe Vicodin and Percocet for post-operative pain. Both of these medications contain a combination of acetaminophen (Tylenol) and a narcotic to help control pain.   It takes between 30 and 45 minutes before pain medication starts to work. It is important to take your medication before your pain level gets too intense.   Nausea is a common side effect of many pain medications. You will want to eat something before taking your pain medicine to help prevent nausea.   If you are taking a prescription pain medication that contains acetaminophen, we recommend that you do not take additional over the counter acetaminophen (Tylenol).  Other pain relieving options:   Using a cold pack to ice the affected area a few times a day (15 to 20 minutes at a time) can help to relieve pain, reduce swelling and bruising.   Elevation of the affected area can also help to reduce pain and swelling.    Post Anesthesia Home Care Instructions  Activity: Get plenty of rest for the remainder of the day. A responsible individual must stay with you for 24 hours following the procedure.  For the next 24 hours, DO NOT: -Drive a car -Roda Shutters -Drink alcoholic beverages -Take any medication unless instructed by your physician -Make any legal decisions or sign important papers.  Meals: Start with liquid foods such as gelatin  or soup. Progress to regular foods as tolerated. Avoid greasy, spicy, heavy foods. If nausea and/or vomiting occur, drink only clear liquids until the nausea and/or vomiting subsides. Call your physician if vomiting continues.  Special Instructions/Symptoms: Your throat may feel dry or sore from the anesthesia or the  breathing tube placed in your throat during surgery. If this causes discomfort, gargle with warm salt water. The discomfort should disappear within 24 hours.  If you had a scopolamine patch placed behind your ear for the management of post- operative nausea and/or vomiting:  1. The medication in the patch is effective for 72 hours, after which it should be removed.  Wrap patch in a tissue and discard in the trash. Wash hands thoroughly with soap and water. 2. You may remove the patch earlier than 72 hours if you experience unpleasant side effects which may include dry mouth, dizziness or visual disturbances. 3. Avoid touching the patch. Wash your hands with soap and water after contact with the patch.    Regional Anesthesia Blocks  1. Numbness or the inability to move the "blocked" extremity may last from 3-48 hours after placement. The length of time depends on the medication injected and your individual response to the medication. If the numbness is not going away after 48 hours, call your surgeon.  2. The extremity that is blocked will need to be protected until the numbness is gone and the  Strength has returned. Because you cannot feel it, you will need to take extra care to avoid injury. Because it may be weak, you may have difficulty moving it or using it. You may not know what position it is in without looking at it while the block is in effect.  3. For blocks in the legs and feet, returning to weight bearing and walking needs to be done carefully. You will need to wait until the numbness is entirely gone and the strength has returned. You should be able to move your leg and foot normally before you try and bear weight or walk. You will need someone to be with you when you first try to ensure you do not fall and possibly risk injury.  4. Bruising and tenderness at the needle site are common side effects and will resolve in a few days.  5. Persistent numbness or new problems with movement  should be communicated to the surgeon or the [facility] [phone] [facility] [phone]).Information for Discharge Teaching: EXPAREL (bupivacaine liposome injectable suspension)   Your surgeon or anesthesiologist gave you EXPAREL(bupivacaine) to help control your pain after surgery.   EXPAREL is a local anesthetic that provides pain relief by numbing the tissue around the surgical site.  EXPAREL is designed to release pain medication over time and can control pain for up to 72 hours.  Depending on how you respond to EXPAREL, you may require less pain medication during your recovery.  Possible side effects:  Temporary loss of sensation or ability to move in the area where bupivacaine was injected.  Nausea, vomiting, constipation  Rarely, numbness and tingling in your mouth or lips, lightheadedness, or anxiety may occur.  Call your doctor right away if you think you may be experiencing any of these sensations, or if you have other questions regarding possible side effects.  Follow all other discharge instructions given to you by your surgeon or nurse. Eat a healthy diet and drink plenty of water or other fluids.  If you return to the hospital for  any reason within 96 hours following the administration of EXPAREL, it is important for health care providers to know that you have received this anesthetic. A teal colored band has been placed on your arm with the date, time and amount of EXPAREL you have received in order to alert and inform your health care providers. Please leave this armband in place for the full 96 hours following administration, and then you may remove the band.

## 2019-12-01 NOTE — Progress Notes (Signed)
Assisted Dr. Oddono with left, ultrasound guided, supraclavicular block. Side rails up, monitors on throughout procedure. See vital signs in flow sheet. Tolerated Procedure well. 

## 2019-12-01 NOTE — Anesthesia Preprocedure Evaluation (Signed)
Anesthesia Evaluation  Patient identified by MRN, date of birth, ID band Patient awake    Reviewed: Allergy & Precautions, H&P , NPO status , Patient's Chart, lab work & pertinent test results, reviewed documented beta blocker date and time   Airway Mallampati: IV  TM Distance: >3 FB Neck ROM: full    Dental no notable dental hx. (+) Poor Dentition, Chipped, Missing, Loose VERY NARROW PALAT :   Pulmonary neg pulmonary ROS,    Pulmonary exam normal breath sounds clear to auscultation       Cardiovascular Exercise Tolerance: Good hypertension, Pt. on medications negative cardio ROS   Rhythm:regular Rate:Normal     Neuro/Psych negative neurological ROS  negative psych ROS   GI/Hepatic negative GI ROS, Neg liver ROS,   Endo/Other  negative endocrine ROS  Renal/GU negative Renal ROS  negative genitourinary   Musculoskeletal   Abdominal   Peds  Hematology negative hematology ROS (+)   Anesthesia Other Findings   Reproductive/Obstetrics negative OB ROS                             Anesthesia Physical Anesthesia Plan  ASA: III  Anesthesia Plan: General   Post-op Pain Management: GA combined w/ Regional for post-op pain   Induction: Intravenous  PONV Risk Score and Plan: 2  Airway Management Planned: LMA  Additional Equipment:   Intra-op Plan:   Post-operative Plan:   Informed Consent: I have reviewed the patients History and Physical, chart, labs and discussed the procedure including the risks, benefits and alternatives for the proposed anesthesia with the patient or authorized representative who has indicated his/her understanding and acceptance.     Dental Advisory Given  Plan Discussed with: CRNA and Anesthesiologist  Anesthesia Plan Comments: ( )        Anesthesia Quick Evaluation

## 2019-12-01 NOTE — Anesthesia Procedure Notes (Signed)
Procedure Name: LMA Insertion Date/Time: 12/01/2019 2:36 PM Performed by: Sheryn Bison, CRNA Pre-anesthesia Checklist: Patient identified, Emergency Drugs available, Suction available and Patient being monitored Patient Re-evaluated:Patient Re-evaluated prior to induction Oxygen Delivery Method: Circle system utilized Preoxygenation: Pre-oxygenation with 100% oxygen Induction Type: IV induction Ventilation: Mask ventilation without difficulty LMA: LMA inserted LMA Size: 4.0 Number of attempts: 1 Airway Equipment and Method: Bite block Placement Confirmation: positive ETCO2 Tube secured with: Tape Dental Injury: Teeth and Oropharynx as per pre-operative assessment

## 2019-12-02 ENCOUNTER — Encounter (HOSPITAL_BASED_OUTPATIENT_CLINIC_OR_DEPARTMENT_OTHER): Payer: Self-pay | Admitting: Orthopaedic Surgery

## 2019-12-02 NOTE — Anesthesia Postprocedure Evaluation (Signed)
Anesthesia Post Note  Patient: Kevin Montoya  Procedure(s) Performed: OPEN REDUCTION INTERNAL FIXATION (ORIF) LEFT DISTAL RADIUS FRACTURE (Left ) CARPAL TUNNEL RELEASE (Left )     Patient location during evaluation: PACU Anesthesia Type: General Level of consciousness: awake and alert Pain management: pain level controlled Vital Signs Assessment: post-procedure vital signs reviewed and stable Respiratory status: spontaneous breathing, nonlabored ventilation, respiratory function stable and patient connected to nasal cannula oxygen Cardiovascular status: blood pressure returned to baseline and stable Postop Assessment: no apparent nausea or vomiting Anesthetic complications: no   No complications documented.  Last Vitals:  Vitals:   12/01/19 1815 12/01/19 1830  BP: (!) 167/106 (!) 162/113  Pulse: 81 88  Resp: 16 19  Temp:    SpO2: 95% 96%    Last Pain:  Vitals:   12/02/19 1038  TempSrc:   PainSc: 0-No pain                 Karandeep Resende

## 2019-12-16 ENCOUNTER — Ambulatory Visit (INDEPENDENT_AMBULATORY_CARE_PROVIDER_SITE_OTHER): Payer: PPO | Admitting: Orthopaedic Surgery

## 2019-12-16 ENCOUNTER — Encounter: Payer: Self-pay | Admitting: Orthopaedic Surgery

## 2019-12-16 ENCOUNTER — Other Ambulatory Visit: Payer: Self-pay

## 2019-12-16 ENCOUNTER — Ambulatory Visit (INDEPENDENT_AMBULATORY_CARE_PROVIDER_SITE_OTHER): Payer: PPO

## 2019-12-16 DIAGNOSIS — S52532A Colles' fracture of left radius, initial encounter for closed fracture: Secondary | ICD-10-CM

## 2019-12-16 MED ORDER — HYDROCODONE-ACETAMINOPHEN 5-325 MG PO TABS: 1.0000 | ORAL_TABLET | Freq: Every day | ORAL | 0 refills | Status: DC | PRN

## 2019-12-16 NOTE — Progress Notes (Signed)
   Post-Op Visit Note   Patient: Kevin Montoya           Date of Birth: Dec 12, 1955           MRN: [id number] Visit Date: 12/16/2019 PCP: Patient, No Pcp Per   Assessment & Plan:  Chief Complaint:  Chief Complaint  Patient presents with  . Left Wrist - Routine Post Op   Visit Diagnoses:  1. Closed Colles' fracture of left radius, initial encounter     Plan: Kevin Montoya is 2-week status post left carpal tunnel release and left distal radius ORIF.  Overall doing well.  Taking half Percocet every other day.  Denies any constitutional symptoms.  Incisions are healed.  Sutures removed today.  Long wrist brace provided today.  May weight-bear through the elbow.  Recheck in 4 weeks with three-view x-rays of the left wrist.  Follow-Up Instructions: Return in about 4 weeks (around 01/13/2020).   Orders:  Orders Placed This Encounter  Procedures  . XR Wrist Complete Left   Meds ordered this encounter  Medications  . HYDROcodone-acetaminophen (NORCO) 5-325 MG tablet    Sig: Take 1-2 tablets by mouth daily as needed.    Dispense:  20 tablet    Refill:  0    Imaging: XR Wrist Complete Left  Result Date: 12/16/2019 Stable fixation alignment of distal radius fracture.   PMFS History: Patient Active Problem List   Diagnosis Date Noted  . Fracture, Colles, left, closed 11/28/2019   Past Medical History:  Diagnosis Date  . GERD (gastroesophageal reflux disease)   . Hypertension   . Left wrist fracture   . Severe needle phobia     History reviewed. No pertinent family history.  Past Surgical History:  Procedure Laterality Date  . CARPAL TUNNEL RELEASE Left 12/01/2019   Procedure: CARPAL TUNNEL RELEASE;  Surgeon: Tarry Kos, MD;  Location: [facility];  Service: Orthopedics;  Laterality: Left;  . extra finger removal     as child  . OPEN REDUCTION INTERNAL FIXATION (ORIF) DISTAL RADIAL FRACTURE Left 12/01/2019   Procedure: OPEN REDUCTION INTERNAL FIXATION (ORIF) LEFT  DISTAL RADIUS FRACTURE;  Surgeon: Tarry Kos, MD;  Location: [facility];  Service: Orthopedics;  Laterality: Left;   Social History   Occupational History  . Not on file  Tobacco Use  . Smoking status: Never Smoker  . Smokeless tobacco: Never Used  Substance and Sexual Activity  . Alcohol use: Yes    Comment: occais  . Drug use: Never  . Sexual activity: Not on file

## 2019-12-17 ENCOUNTER — Telehealth: Payer: Self-pay | Admitting: Orthopaedic Surgery

## 2019-12-17 MED ORDER — IBUPROFEN 800 MG PO TABS: 800.0000 mg | ORAL_TABLET | Freq: Three times a day (TID) | ORAL | 2 refills | Status: DC | PRN

## 2019-12-17 NOTE — Telephone Encounter (Signed)
Pls advise.  

## 2019-12-17 NOTE — Telephone Encounter (Signed)
done

## 2019-12-17 NOTE — Telephone Encounter (Signed)
Patient's wife called requesting Ibuprofen 800 mg. Please send to [facility]. Please call patient's wife when medication has been called in. Phone number is [phone].

## 2020-01-13 ENCOUNTER — Encounter: Payer: Self-pay | Admitting: Orthopaedic Surgery

## 2020-01-13 ENCOUNTER — Ambulatory Visit (INDEPENDENT_AMBULATORY_CARE_PROVIDER_SITE_OTHER): Payer: PPO

## 2020-01-13 ENCOUNTER — Ambulatory Visit (INDEPENDENT_AMBULATORY_CARE_PROVIDER_SITE_OTHER): Payer: PPO | Admitting: Orthopaedic Surgery

## 2020-01-13 DIAGNOSIS — S52532A Colles' fracture of left radius, initial encounter for closed fracture: Secondary | ICD-10-CM

## 2020-01-13 NOTE — Progress Notes (Signed)
   Post-Op Visit Note   Patient: Kevin Montoya           Date of Birth: [birth year]           MRN: [id number] Visit Date: 01/13/2020 PCP: Patient, No Pcp Per   Assessment & Plan:  Chief Complaint:  Chief Complaint  Patient presents with  . Left Wrist - Pain   Visit Diagnoses:  1. Closed Colles' fracture of left radius, initial encounter     Plan: Patient is a pleasant 64 year old gentleman who comes in today 6 weeks out ORIF left distal radius fracture and carpal tunnel release 12/01/2019.  He has been doing well.  He has been compliant in a wrist splint.  Minimal to no pain.  Examination of his left wrist reveals fully healed surgical scars without complication.  He does have moderate swelling to the hand.  Fingers are warm and well-perfused.  He is neurovascular intact distally.  He has minimal tenderness to the fracture site.  At this point, we can discontinue his splint.  We will start him in hand therapy and an internal referral has been made.  He will follow up with [place] when he is 12 weeks out from surgery.  Follow-Up Instructions: Return in about 7 weeks (around 03/02/2020).   Orders:  Orders Placed This Encounter  Procedures  . XR Wrist Complete Left  . Ambulatory referral to Physical Therapy   No orders of the defined types were placed in this encounter.   Imaging: XR Wrist Complete Left  Result Date: 01/13/2020 X-rays demonstrate stable alignment of the fracture with evidence of bony consolidation   PMFS History: Patient Active Problem List   Diagnosis Date Noted  . Fracture, Colles, left, closed 11/28/2019   Past Medical History:  Diagnosis Date  . GERD (gastroesophageal reflux disease)   . Hypertension   . Left wrist fracture   . Severe needle phobia     History reviewed. No pertinent family history.  Past Surgical History:  Procedure Laterality Date  . CARPAL TUNNEL RELEASE Left 12/01/2019   Procedure: CARPAL TUNNEL RELEASE;  Surgeon: Tarry Kos, MD;   Location: [facility];  Service: Orthopedics;  Laterality: Left;  . extra finger removal     as child  . OPEN REDUCTION INTERNAL FIXATION (ORIF) DISTAL RADIAL FRACTURE Left 12/01/2019   Procedure: OPEN REDUCTION INTERNAL FIXATION (ORIF) LEFT DISTAL RADIUS FRACTURE;  Surgeon: Tarry Kos, MD;  Location:  SURGERY CENTER;  Service: Orthopedics;  Laterality: Left;   Social History   Occupational History  . Not on file  Tobacco Use  . Smoking status: Never Smoker  . Smokeless tobacco: Never Used  Substance and Sexual Activity  . Alcohol use: Yes    Comment: occais  . Drug use: Never  . Sexual activity: Not on file

## 2020-01-19 ENCOUNTER — Telehealth: Payer: Self-pay | Admitting: Physician Assistant

## 2020-01-19 NOTE — Telephone Encounter (Signed)
Faxed to provided number  

## 2020-01-19 NOTE — Telephone Encounter (Signed)
Patient's wife Alan Ripper called requesting patient referral for to be sent [facility] on [address] [facility] [place]. Patient wife said her husband is legally blind and needs this referral for that center is easier and accepts there insurance, Fax number to facility is [phone]. Patient phone number is [phone].

## 2020-01-19 NOTE — Telephone Encounter (Signed)
Pt referral made. Not sure if I need to make another referral or not.  Referral for PT to be sent [facility] on [address] [facility] [place]. Patient wife said her husband is legally blind.

## 2020-01-23 DIAGNOSIS — R29898 Other symptoms and signs involving the musculoskeletal system: Secondary | ICD-10-CM | POA: Diagnosis not present

## 2020-01-23 DIAGNOSIS — M25532 Pain in left wrist: Secondary | ICD-10-CM | POA: Diagnosis not present

## 2020-01-23 DIAGNOSIS — M25632 Stiffness of left wrist, not elsewhere classified: Secondary | ICD-10-CM | POA: Diagnosis not present

## 2020-01-23 DIAGNOSIS — S52532D Colles' fracture of left radius, subsequent encounter for closed fracture with routine healing: Secondary | ICD-10-CM | POA: Diagnosis not present

## 2020-02-02 DIAGNOSIS — R29898 Other symptoms and signs involving the musculoskeletal system: Secondary | ICD-10-CM | POA: Diagnosis not present

## 2020-02-02 DIAGNOSIS — S52532D Colles' fracture of left radius, subsequent encounter for closed fracture with routine healing: Secondary | ICD-10-CM | POA: Diagnosis not present

## 2020-02-02 DIAGNOSIS — M25532 Pain in left wrist: Secondary | ICD-10-CM | POA: Diagnosis not present

## 2020-02-02 DIAGNOSIS — M25632 Stiffness of left wrist, not elsewhere classified: Secondary | ICD-10-CM | POA: Diagnosis not present

## 2020-02-09 DIAGNOSIS — M25632 Stiffness of left wrist, not elsewhere classified: Secondary | ICD-10-CM | POA: Diagnosis not present

## 2020-02-09 DIAGNOSIS — S52532D Colles' fracture of left radius, subsequent encounter for closed fracture with routine healing: Secondary | ICD-10-CM | POA: Diagnosis not present

## 2020-02-09 DIAGNOSIS — R29898 Other symptoms and signs involving the musculoskeletal system: Secondary | ICD-10-CM | POA: Diagnosis not present

## 2020-02-09 DIAGNOSIS — M25532 Pain in left wrist: Secondary | ICD-10-CM | POA: Diagnosis not present

## 2020-02-23 DIAGNOSIS — M25632 Stiffness of left wrist, not elsewhere classified: Secondary | ICD-10-CM | POA: Diagnosis not present

## 2020-02-23 DIAGNOSIS — M25532 Pain in left wrist: Secondary | ICD-10-CM | POA: Diagnosis not present

## 2020-02-23 DIAGNOSIS — S52532D Colles' fracture of left radius, subsequent encounter for closed fracture with routine healing: Secondary | ICD-10-CM | POA: Diagnosis not present

## 2020-02-23 DIAGNOSIS — R29898 Other symptoms and signs involving the musculoskeletal system: Secondary | ICD-10-CM | POA: Diagnosis not present

## 2020-02-27 ENCOUNTER — Encounter: Payer: Self-pay | Admitting: Orthopaedic Surgery

## 2020-02-27 ENCOUNTER — Ambulatory Visit (INDEPENDENT_AMBULATORY_CARE_PROVIDER_SITE_OTHER): Payer: PPO

## 2020-02-27 ENCOUNTER — Ambulatory Visit (INDEPENDENT_AMBULATORY_CARE_PROVIDER_SITE_OTHER): Payer: PPO | Admitting: Orthopaedic Surgery

## 2020-02-27 ENCOUNTER — Other Ambulatory Visit: Payer: Self-pay

## 2020-02-27 DIAGNOSIS — M25532 Pain in left wrist: Secondary | ICD-10-CM

## 2020-02-27 DIAGNOSIS — S52532D Colles' fracture of left radius, subsequent encounter for closed fracture with routine healing: Secondary | ICD-10-CM

## 2020-02-27 NOTE — Progress Notes (Signed)
   Office Visit Note   Patient: Kevin Montoya           Date of Birth: May 07, 1956           MRN: [id number] Visit Date: 02/27/2020              Requested by: No referring provider defined for this encounter. PCP: Patient, No Pcp Per   Assessment & Plan: Visit Diagnoses:  1. Pain in left wrist   2. Closed Colles' fracture of left radius with routine healing, subsequent encounter     Plan:   Impression is 12 weeks status post ORIF left distal radius fracture at this point, he will continue with hand therapy for a few more sessions and continue doing it at home as well.  He will follow up with [place] as needed as clinically and radiographically his fracture has healed.   Follow-Up Instructions: Return if symptoms worsen or fail to improve.   Orders:  Orders Placed This Encounter  Procedures  . XR Wrist Complete Left   No orders of the defined types were placed in this encounter.     Procedures: No procedures performed   Clinical Data: No additional findings.   Subjective: Chief Complaint  Patient presents with  . Left Wrist - Pain    HPI patient is a pleasant 64 year old gentleman who comes in today 12 weeks out ORIF left distal radius fracture.  He has been doing well.  He has been in hand therapy making progress.  Hedenies any pain to the wrist.    Objective: Vital Signs: There were no vitals taken for this visit.    Ortho Exam Examination of the left wrist reveals no tenderness to palpation to the fracture site.  He has mild swelling.  He lacks slight range of motion with flexion extension.  Fingers are warm well perfused.   Specialty Comments:  No specialty comments available.  Imaging: XR Wrist Complete Left  Result Date: 02/27/2020 X-rays demonstrate stable alignment of the hardware with bony consolidation to the fracture site    PMFS History: Patient Active Problem List   Diagnosis Date Noted  . Fracture, Colles, left, closed 11/28/2019   Past  Medical History:  Diagnosis Date  . GERD (gastroesophageal reflux disease)   . Hypertension   . Left wrist fracture   . Severe needle phobia     History reviewed. No pertinent family history.  Past Surgical History:  Procedure Laterality Date  . CARPAL TUNNEL RELEASE Left 12/01/2019   Procedure: CARPAL TUNNEL RELEASE;  Surgeon: Tarry Kos, MD;  Location: [facility];  Service: Orthopedics;  Laterality: Left;  . extra finger removal     as child  . OPEN REDUCTION INTERNAL FIXATION (ORIF) DISTAL RADIAL FRACTURE Left 12/01/2019   Procedure: OPEN REDUCTION INTERNAL FIXATION (ORIF) LEFT DISTAL RADIUS FRACTURE;  Surgeon: Tarry Kos, MD;  Location: [facility];  Service: Orthopedics;  Laterality: Left;   Social History   Occupational History  . Not on file  Tobacco Use  . Smoking status: Never Smoker  . Smokeless tobacco: Never Used  Substance and Sexual Activity  . Alcohol use: Yes    Comment: occais  . Drug use: Never  . Sexual activity: Not on file

## 2020-03-08 DIAGNOSIS — S52532D Colles' fracture of left radius, subsequent encounter for closed fracture with routine healing: Secondary | ICD-10-CM | POA: Diagnosis not present

## 2020-03-08 DIAGNOSIS — R29898 Other symptoms and signs involving the musculoskeletal system: Secondary | ICD-10-CM | POA: Diagnosis not present

## 2020-03-08 DIAGNOSIS — M25632 Stiffness of left wrist, not elsewhere classified: Secondary | ICD-10-CM | POA: Diagnosis not present

## 2020-03-08 DIAGNOSIS — M25532 Pain in left wrist: Secondary | ICD-10-CM | POA: Diagnosis not present

## 2020-03-16 DIAGNOSIS — R29898 Other symptoms and signs involving the musculoskeletal system: Secondary | ICD-10-CM | POA: Diagnosis not present

## 2020-03-16 DIAGNOSIS — S52532D Colles' fracture of left radius, subsequent encounter for closed fracture with routine healing: Secondary | ICD-10-CM | POA: Diagnosis not present

## 2020-03-16 DIAGNOSIS — M25532 Pain in left wrist: Secondary | ICD-10-CM | POA: Diagnosis not present

## 2020-03-16 DIAGNOSIS — M25632 Stiffness of left wrist, not elsewhere classified: Secondary | ICD-10-CM | POA: Diagnosis not present

## 2020-03-22 DIAGNOSIS — S52532D Colles' fracture of left radius, subsequent encounter for closed fracture with routine healing: Secondary | ICD-10-CM | POA: Diagnosis not present

## 2020-03-22 DIAGNOSIS — M25632 Stiffness of left wrist, not elsewhere classified: Secondary | ICD-10-CM | POA: Diagnosis not present

## 2020-03-22 DIAGNOSIS — M25532 Pain in left wrist: Secondary | ICD-10-CM | POA: Diagnosis not present

## 2020-03-22 DIAGNOSIS — R29898 Other symptoms and signs involving the musculoskeletal system: Secondary | ICD-10-CM | POA: Diagnosis not present

## 2020-05-20 DIAGNOSIS — Z23 Encounter for immunization: Secondary | ICD-10-CM | POA: Diagnosis not present

## 2020-08-03 ENCOUNTER — Ambulatory Visit (INDEPENDENT_AMBULATORY_CARE_PROVIDER_SITE_OTHER): Payer: PPO

## 2020-08-03 ENCOUNTER — Ambulatory Visit: Payer: PPO | Admitting: Orthopaedic Surgery

## 2020-08-03 ENCOUNTER — Encounter: Payer: Self-pay | Admitting: Physician Assistant

## 2020-08-03 DIAGNOSIS — M25532 Pain in left wrist: Secondary | ICD-10-CM

## 2020-08-03 MED ORDER — ONDANSETRON HCL 4 MG PO TABS: 4.0000 mg | ORAL_TABLET | Freq: Three times a day (TID) | ORAL | 0 refills | Status: AC | PRN

## 2020-08-03 MED ORDER — OXYCODONE-ACETAMINOPHEN 5-325 MG PO TABS: ORAL_TABLET | ORAL | 0 refills | Status: AC

## 2020-08-03 NOTE — Progress Notes (Addendum)
Office Visit Note   Patient: Kevin Montoya           Date of Birth: [birth year]           MRN: [id number] Visit Date: 08/03/2020              Requested by: No referring provider defined for this encounter. PCP: Patient, No Pcp Per   Assessment & Plan: Visit Diagnoses:  1. Pain in left wrist     Plan: Impression is peri-implant radial shaft fracture well as comminuted fracture to the ulnar styloid.  Radiographic findings reviewed with the patient and family today and revision ORIF of radial shaft fracture with hardware removal and ORIF of distal ulna fracture have been recommended.  Discussed risks, benefits, alternatives.  In the meantime, we will place him in a sugar tong splint.  He will elevate as much as possible for pain and swelling.  Have called in Percocet to take for pain.  We will wait until next week to perform surgery in order to allow swelling to subside.   Total face to face encounter time was greater than 25 minutes and over half of this time was spent in counseling and/or coordination of care.  Follow-Up Instructions: Return for post-op.   Orders:  Orders Placed This Encounter  Procedures  . XR Wrist Complete Left   Meds ordered this encounter  Medications  . oxyCODONE-acetaminophen (PERCOCET) 5-325 MG tablet    Sig: Take 1/2-1 tab po tid prn pain    Dispense:  30 tablet    Refill:  0  . ondansetron (ZOFRAN) 4 MG tablet    Sig: Take 1 tablet (4 mg total) by mouth every 8 (eight) hours as needed for nausea or vomiting.    Dispense:  40 tablet    Refill:  0      Procedures: No procedures performed   Clinical Data: No additional findings.   Subjective: Chief Complaint  Patient presents with  . Left Wrist - Pain    HPI patient is a pleasant 64 year old legally blind gentleman who comes in today following an injury which occurred yesterday.  He notes that he fell off of a porch landing on his left wrist.  Of note, he is status post ORIF left distal  radius fracture back in July 2021.  He comes in today for further evaluation treatment recommendation.  Review of Systems as detailed in HPI.  All others reviewed and are negative.   Objective: Vital Signs: There were no vitals taken for this visit.  Physical Exam well-developed well-nourished gentleman in no acute distress.  Alert and oriented x3.  Ortho Exam left wrist exam shows moderate swelling.  He does have a noticeable deformity.  Moderate tenderness throughout.  No skin changes.  He is neurovascular intact distally.  Specialty Comments:  No specialty comments available.  Imaging: X-rays demonstrate a displaced fracture through the radial shaft just proximal to the plate.  Displaced distal ulna fracture.      PMFS History: Patient Active Problem List   Diagnosis Date Noted  . Fracture, Colles, left, closed 11/28/2019   Past Medical History:  Diagnosis Date  . GERD (gastroesophageal reflux disease)   . Hypertension   . Left wrist fracture   . Severe needle phobia     History reviewed. No pertinent family history.  Past Surgical History:  Procedure Laterality Date  . CARPAL TUNNEL RELEASE Left 12/01/2019   Procedure: CARPAL TUNNEL RELEASE;  Surgeon: Tarry Kos, MD;  Location: [facility];  Service: Orthopedics;  Laterality: Left;  . extra finger removal     as child  . OPEN REDUCTION INTERNAL FIXATION (ORIF) DISTAL RADIAL FRACTURE Left 12/01/2019   Procedure: OPEN REDUCTION INTERNAL FIXATION (ORIF) LEFT DISTAL RADIUS FRACTURE;  Surgeon: Tarry Kos, MD;  Location: [facility];  Service: Orthopedics;  Laterality: Left;   Social History   Occupational History  . Not on file  Tobacco Use  . Smoking status: Never Smoker  . Smokeless tobacco: Never Used  Substance and Sexual Activity  . Alcohol use: Yes    Comment: occais  . Drug use: Never  . Sexual activity: Not on file

## 2020-08-04 ENCOUNTER — Telehealth: Payer: Self-pay | Admitting: Physician Assistant

## 2020-08-04 ENCOUNTER — Telehealth: Payer: Self-pay | Admitting: Orthopaedic Surgery

## 2020-08-04 NOTE — Telephone Encounter (Signed)
Pt wife called and would like to know If she is suppose to prop her husbands arm up or if she is keep it in the sling. Please call her back CB [phone]

## 2020-08-04 NOTE — Telephone Encounter (Signed)
Also need CPT code(s).  Thanks!

## 2020-08-04 NOTE — Telephone Encounter (Signed)
Xu, I filled out surgery sheet and put on sherrie's desk.  Where do we want to operate, what system do you want to use and which anesthesia do you prefer?    Sherrie, I spoke to patient this am to let him know we would be operating later next week.  I also told him you would be calling him.

## 2020-08-04 NOTE — Telephone Encounter (Signed)
I would prop up with pillows but should also be able to wear sling at same time

## 2020-08-04 NOTE — Telephone Encounter (Signed)
Wife aware

## 2020-08-06 ENCOUNTER — Telehealth: Payer: Self-pay | Admitting: Orthopaedic Surgery

## 2020-08-06 NOTE — Telephone Encounter (Signed)
Patient's wife Alan Ripper called advised patient need Rx refilled Ibuprofen.  Alan Ripper asked if patient can take a shower? The number to contact Alan Ripper is [phone]

## 2020-08-09 ENCOUNTER — Encounter (HOSPITAL_BASED_OUTPATIENT_CLINIC_OR_DEPARTMENT_OTHER): Payer: Self-pay | Admitting: Orthopaedic Surgery

## 2020-08-09 ENCOUNTER — Other Ambulatory Visit: Payer: Self-pay

## 2020-08-09 ENCOUNTER — Other Ambulatory Visit: Payer: Self-pay | Admitting: Physician Assistant

## 2020-08-09 MED ORDER — IBUPROFEN 800 MG PO TABS: 800.0000 mg | ORAL_TABLET | Freq: Three times a day (TID) | ORAL | 2 refills | Status: AC | PRN

## 2020-08-09 NOTE — Telephone Encounter (Signed)
Patient aware.

## 2020-08-09 NOTE — Progress Notes (Signed)
Patient had ORIF at [facility] 11/2019. Patient needed oral versed in pre op for IV placement. He became very sedated and drowsy after versed, and needed flumazenil after surgery in PACU, and discharged home with wife and mother. Reviewed with Dr. Hyacinth Meeker. Will continue with surgery as scheduled and set an anesthesia plan on the DOS.

## 2020-08-09 NOTE — Telephone Encounter (Signed)
Sent in.  Ok to shower, but do not get splint wet

## 2020-08-10 ENCOUNTER — Other Ambulatory Visit (HOSPITAL_COMMUNITY): Payer: PPO

## 2020-08-11 ENCOUNTER — Other Ambulatory Visit (HOSPITAL_COMMUNITY)
Admission: RE | Admit: 2020-08-11 | Discharge: 2020-08-11 | Disposition: A | Discharge status: Home / Self Care | Payer: PPO | Source: Ambulatory Visit | Attending: Orthopaedic Surgery | Admitting: Orthopaedic Surgery

## 2020-08-11 DIAGNOSIS — Z20822 Contact with and (suspected) exposure to covid-19: Secondary | ICD-10-CM | POA: Diagnosis not present

## 2020-08-11 DIAGNOSIS — Z01812 Encounter for preprocedural laboratory examination: Secondary | ICD-10-CM | POA: Insufficient documentation

## 2020-08-11 LAB — SARS CORONAVIRUS 2 (TAT 6-24 HRS): SARS Coronavirus 2: NEGATIVE

## 2020-08-13 ENCOUNTER — Ambulatory Visit (HOSPITAL_BASED_OUTPATIENT_CLINIC_OR_DEPARTMENT_OTHER)
Admission: RE | Admit: 2020-08-13 | Discharge: 2020-08-13 | Disposition: A | Discharge status: Home / Self Care | Payer: PPO | Attending: Orthopaedic Surgery | Admitting: Orthopaedic Surgery

## 2020-08-13 ENCOUNTER — Ambulatory Visit (HOSPITAL_BASED_OUTPATIENT_CLINIC_OR_DEPARTMENT_OTHER): Payer: PPO | Admitting: Certified Registered"

## 2020-08-13 ENCOUNTER — Encounter (HOSPITAL_BASED_OUTPATIENT_CLINIC_OR_DEPARTMENT_OTHER)
Admission: RE | Disposition: A | Discharge status: Home / Self Care | Payer: Self-pay | Source: Home / Self Care | Attending: Orthopaedic Surgery

## 2020-08-13 ENCOUNTER — Encounter (HOSPITAL_BASED_OUTPATIENT_CLINIC_OR_DEPARTMENT_OTHER): Payer: Self-pay | Admitting: Orthopaedic Surgery

## 2020-08-13 ENCOUNTER — Other Ambulatory Visit: Payer: Self-pay

## 2020-08-13 DIAGNOSIS — S52502A Unspecified fracture of the lower end of left radius, initial encounter for closed fracture: Secondary | ICD-10-CM | POA: Diagnosis not present

## 2020-08-13 DIAGNOSIS — S52302A Unspecified fracture of shaft of left radius, initial encounter for closed fracture: Secondary | ICD-10-CM | POA: Diagnosis not present

## 2020-08-13 DIAGNOSIS — Z79899 Other long term (current) drug therapy: Secondary | ICD-10-CM | POA: Diagnosis not present

## 2020-08-13 DIAGNOSIS — X58XXXA Exposure to other specified factors, initial encounter: Secondary | ICD-10-CM | POA: Insufficient documentation

## 2020-08-13 DIAGNOSIS — S52202A Unspecified fracture of shaft of left ulna, initial encounter for closed fracture: Secondary | ICD-10-CM | POA: Diagnosis not present

## 2020-08-13 DIAGNOSIS — K219 Gastro-esophageal reflux disease without esophagitis: Secondary | ICD-10-CM | POA: Diagnosis not present

## 2020-08-13 DIAGNOSIS — Z880 Allergy status to penicillin: Secondary | ICD-10-CM | POA: Diagnosis not present

## 2020-08-13 DIAGNOSIS — S52612A Displaced fracture of left ulna styloid process, initial encounter for closed fracture: Secondary | ICD-10-CM | POA: Insufficient documentation

## 2020-08-13 DIAGNOSIS — I10 Essential (primary) hypertension: Secondary | ICD-10-CM | POA: Diagnosis not present

## 2020-08-13 DIAGNOSIS — H547 Unspecified visual loss: Secondary | ICD-10-CM | POA: Insufficient documentation

## 2020-08-13 HISTORY — PX: ULNAR HEAD EXCISION: SHX6344

## 2020-08-13 HISTORY — PX: ORIF RADIAL FRACTURE: SHX5113

## 2020-08-13 HISTORY — PX: HARDWARE REMOVAL: SHX979

## 2020-08-13 HISTORY — DX: Post-traumatic stress disorder, unspecified: F43.10

## 2020-08-13 HISTORY — DX: Unspecified visual loss: H54.7

## 2020-08-13 SURGERY — EXCISION, ULNA, HEAD
Anesthesia: General | Site: Arm Lower | Laterality: Left

## 2020-08-13 MED ORDER — MIDAZOLAM HCL 2 MG/ML PO SYRP
ORAL_SOLUTION | ORAL | Status: AC
  Filled 2020-08-13: qty 5

## 2020-08-13 MED ORDER — PROPOFOL 10 MG/ML IV BOLUS
INTRAVENOUS | Status: DC | PRN
Start: 2020-08-13 — End: 2020-08-13
  Administered 2020-08-13: 200 mg via INTRAVENOUS

## 2020-08-13 MED ORDER — LIDOCAINE HCL (CARDIAC) PF 100 MG/5ML IV SOSY
PREFILLED_SYRINGE | INTRAVENOUS | Status: DC | PRN
  Administered 2020-08-13: 20 mg via INTRAVENOUS

## 2020-08-13 MED ORDER — LACTATED RINGERS IV SOLN: INTRAVENOUS | Status: DC

## 2020-08-13 MED ORDER — DEXAMETHASONE SODIUM PHOSPHATE 4 MG/ML IJ SOLN
INTRAMUSCULAR | Status: DC | PRN
  Administered 2020-08-13: 5 mg via INTRAVENOUS

## 2020-08-13 MED ORDER — MIDAZOLAM HCL 2 MG/2ML IJ SOLN
INTRAMUSCULAR | Status: AC
  Filled 2020-08-13: qty 2

## 2020-08-13 MED ORDER — CEFAZOLIN SODIUM-DEXTROSE 2-4 GM/100ML-% IV SOLN
2.0000 g | INTRAVENOUS | Status: AC
  Administered 2020-08-13: 2 g via INTRAVENOUS

## 2020-08-13 MED ORDER — ONDANSETRON HCL 4 MG/2ML IJ SOLN
INTRAMUSCULAR | Status: AC
  Filled 2020-08-13: qty 2

## 2020-08-13 MED ORDER — LABETALOL HCL 5 MG/ML IV SOLN
5.0000 mg | Freq: Once | INTRAVENOUS | Status: AC
  Administered 2020-08-13: 5 mg via INTRAVENOUS

## 2020-08-13 MED ORDER — CALCIUM CARBONATE-VITAMIN D 500-200 MG-UNIT PO TABS: 1.0000 | ORAL_TABLET | Freq: Three times a day (TID) | ORAL | 6 refills | Status: AC

## 2020-08-13 MED ORDER — FENTANYL CITRATE (PF) 100 MCG/2ML IJ SOLN
INTRAMUSCULAR | Status: AC
  Filled 2020-08-13: qty 2

## 2020-08-13 MED ORDER — CELECOXIB 200 MG PO CAPS
200.0000 mg | ORAL_CAPSULE | Freq: Once | ORAL | Status: AC
  Administered 2020-08-13: 200 mg via ORAL

## 2020-08-13 MED ORDER — DEXAMETHASONE SODIUM PHOSPHATE 10 MG/ML IJ SOLN
INTRAMUSCULAR | Status: AC
  Filled 2020-08-13: qty 1

## 2020-08-13 MED ORDER — ONDANSETRON HCL 4 MG/2ML IJ SOLN
INTRAMUSCULAR | Status: DC | PRN
  Administered 2020-08-13: 4 mg via INTRAVENOUS

## 2020-08-13 MED ORDER — CELECOXIB 200 MG PO CAPS
ORAL_CAPSULE | ORAL | Status: AC
  Filled 2020-08-13: qty 1

## 2020-08-13 MED ORDER — CEFAZOLIN SODIUM-DEXTROSE 2-4 GM/100ML-% IV SOLN
INTRAVENOUS | Status: AC
  Filled 2020-08-13: qty 100

## 2020-08-13 MED ORDER — PHENYLEPHRINE 40 MCG/ML (10ML) SYRINGE FOR IV PUSH (FOR BLOOD PRESSURE SUPPORT)
PREFILLED_SYRINGE | INTRAVENOUS | Status: DC | PRN
  Administered 2020-08-13: 80 ug via INTRAVENOUS
  Administered 2020-08-13: 120 ug via INTRAVENOUS

## 2020-08-13 MED ORDER — LABETALOL HCL 5 MG/ML IV SOLN
INTRAVENOUS | Status: AC
  Filled 2020-08-13: qty 4

## 2020-08-13 MED ORDER — PROPOFOL 10 MG/ML IV BOLUS
INTRAVENOUS | Status: AC
  Filled 2020-08-13: qty 20

## 2020-08-13 MED ORDER — PHENYLEPHRINE 40 MCG/ML (10ML) SYRINGE FOR IV PUSH (FOR BLOOD PRESSURE SUPPORT)
PREFILLED_SYRINGE | INTRAVENOUS | Status: AC
  Filled 2020-08-13: qty 10

## 2020-08-13 MED ORDER — VITAMIN C 500 MG PO CHEW: 500.0000 mg | CHEWABLE_TABLET | Freq: Two times a day (BID) | ORAL | 0 refills | Status: AC

## 2020-08-13 MED ORDER — MIDAZOLAM HCL 2 MG/ML PO SYRP
10.0000 mg | ORAL_SOLUTION | Freq: Once | ORAL | Status: AC
  Administered 2020-08-13: 10 mg via ORAL

## 2020-08-13 MED ORDER — MIDAZOLAM HCL 2 MG/2ML IJ SOLN
1.0000 mg | Freq: Once | INTRAMUSCULAR | Status: AC
  Administered 2020-08-13: 1 mg via INTRAVENOUS

## 2020-08-13 MED ORDER — OXYCODONE-ACETAMINOPHEN 5-325 MG PO TABS: 1.0000 | ORAL_TABLET | Freq: Three times a day (TID) | ORAL | 0 refills | Status: AC | PRN

## 2020-08-13 MED ORDER — FENTANYL CITRATE (PF) 100 MCG/2ML IJ SOLN
50.0000 ug | Freq: Once | INTRAMUSCULAR | Status: AC
  Administered 2020-08-13: 50 ug via INTRAVENOUS

## 2020-08-13 MED ORDER — FENTANYL CITRATE (PF) 100 MCG/2ML IJ SOLN
25.0000 ug | INTRAMUSCULAR | Status: DC | PRN
  Administered 2020-08-13 (×2): 50 ug via INTRAVENOUS

## 2020-08-13 MED ORDER — ACETAMINOPHEN 500 MG PO TABS
1000.0000 mg | ORAL_TABLET | Freq: Once | ORAL | Status: AC
  Administered 2020-08-13: 1000 mg via ORAL

## 2020-08-13 MED ORDER — EPHEDRINE SULFATE 50 MG/ML IJ SOLN
INTRAMUSCULAR | Status: DC | PRN
  Administered 2020-08-13: 10 mg via INTRAVENOUS

## 2020-08-13 MED ORDER — ACETAMINOPHEN 500 MG PO TABS
ORAL_TABLET | ORAL | Status: AC
  Filled 2020-08-13: qty 2

## 2020-08-13 MED ORDER — AMISULPRIDE (ANTIEMETIC) 5 MG/2ML IV SOLN: 10.0000 mg | Freq: Once | INTRAVENOUS | Status: DC | PRN

## 2020-08-13 SURGICAL SUPPLY — 106 items
ADH SKN CLS APL DERMABOND .7 (GAUZE/BANDAGES/DRESSINGS)
APL SKNCLS STERI-STRIP NONHPOA (GAUZE/BANDAGES/DRESSINGS)
BANDAGE ESMARK 6X9 LF (GAUZE/BANDAGES/DRESSINGS) IMPLANT
BENZOIN TINCTURE PRP APPL 2/3 (GAUZE/BANDAGES/DRESSINGS) IMPLANT
BIT DRILL 2.2 SS TIBIAL (BIT) ×2 IMPLANT
BLADE HEX COATED 2.75 (ELECTRODE) IMPLANT
BLADE SURG 15 STRL LF DISP TIS (BLADE) ×2 IMPLANT
BLADE SURG 15 STRL SS (BLADE) ×4
BNDG CMPR 9X4 STRL LF SNTH (GAUZE/BANDAGES/DRESSINGS) ×1
BNDG CMPR 9X6 STRL LF SNTH (GAUZE/BANDAGES/DRESSINGS)
BNDG COHESIVE 3X5 TAN STRL LF (GAUZE/BANDAGES/DRESSINGS) IMPLANT
BNDG COHESIVE 4X5 TAN STRL (GAUZE/BANDAGES/DRESSINGS) ×2 IMPLANT
BNDG ELASTIC 4X5.8 VLCR STR LF (GAUZE/BANDAGES/DRESSINGS) ×4 IMPLANT
BNDG ELASTIC 6X5.8 VLCR STR LF (GAUZE/BANDAGES/DRESSINGS) IMPLANT
BNDG ESMARK 4X9 LF (GAUZE/BANDAGES/DRESSINGS) ×2 IMPLANT
BNDG ESMARK 6X9 LF (GAUZE/BANDAGES/DRESSINGS)
CANISTER SUCT 1200ML W/VALVE (MISCELLANEOUS) IMPLANT
CORD BIPOLAR FORCEPS 12FT (ELECTRODE) IMPLANT
COVER BACK TABLE 60X90IN (DRAPES) ×2 IMPLANT
COVER WAND RF STERILE (DRAPES) IMPLANT
CUFF TOURN SGL QUICK 18X3 (MISCELLANEOUS) ×2 IMPLANT
CUFF TOURN SGL QUICK 24 (TOURNIQUET CUFF)
CUFF TOURN SGL QUICK 34 (TOURNIQUET CUFF)
CUFF TRNQT CYL 24X4X16.5-23 (TOURNIQUET CUFF) IMPLANT
CUFF TRNQT CYL 34X4.125X (TOURNIQUET CUFF) IMPLANT
DECANTER SPIKE VIAL GLASS SM (MISCELLANEOUS) IMPLANT
DERMABOND ADVANCED (GAUZE/BANDAGES/DRESSINGS)
DERMABOND ADVANCED .7 DNX12 (GAUZE/BANDAGES/DRESSINGS) IMPLANT
DRAPE EXTREMITY T 121X128X90 (DISPOSABLE) ×2 IMPLANT
DRAPE IMP U-DRAPE 54X76 (DRAPES) ×2 IMPLANT
DRAPE INCISE IOBAN 66X45 STRL (DRAPES) IMPLANT
DRAPE OEC MINIVIEW 54X84 (DRAPES) ×2 IMPLANT
DRAPE SURG 17X23 STRL (DRAPES) ×2 IMPLANT
DRAPE U-SHAPE 47X51 STRL (DRAPES) IMPLANT
DURAPREP 26ML APPLICATOR (WOUND CARE) IMPLANT
ELECT REM PT RETURN 9FT ADLT (ELECTROSURGICAL) ×2
ELECTRODE REM PT RTRN 9FT ADLT (ELECTROSURGICAL) ×1 IMPLANT
GAUZE 4X4 16PLY RFD (DISPOSABLE) IMPLANT
GAUZE SPONGE 4X4 12PLY STRL (GAUZE/BANDAGES/DRESSINGS) ×2 IMPLANT
GAUZE XEROFORM 1X8 LF (GAUZE/BANDAGES/DRESSINGS) ×2 IMPLANT
GLOVE SURG LTX SZ7 (GLOVE) ×2 IMPLANT
GLOVE SURG NEOP MICRO LF SZ7.5 (GLOVE) ×2 IMPLANT
GLOVE SURG SYN 7.5  E (GLOVE) ×1
GLOVE SURG SYN 7.5 E (GLOVE) ×1 IMPLANT
GLOVE SURG UNDER POLY LF SZ7 (GLOVE) ×2 IMPLANT
GOWN STRL REIN XL XLG (GOWN DISPOSABLE) ×4 IMPLANT
GOWN STRL REUS W/ TWL LRG LVL3 (GOWN DISPOSABLE) ×1 IMPLANT
GOWN STRL REUS W/ TWL XL LVL3 (GOWN DISPOSABLE) IMPLANT
GOWN STRL REUS W/TWL 2XL LVL3 (GOWN DISPOSABLE) ×2 IMPLANT
GOWN STRL REUS W/TWL LRG LVL3 (GOWN DISPOSABLE) ×2
GOWN STRL REUS W/TWL XL LVL3 (GOWN DISPOSABLE)
MANIFOLD NEPTUNE II (INSTRUMENTS) ×2 IMPLANT
NEEDLE HYPO 22GX1.5 SAFETY (NEEDLE) IMPLANT
NEEDLE HYPO 25X1 1.5 SAFETY (NEEDLE) IMPLANT
NS IRRIG 1000ML POUR BTL (IV SOLUTION) ×2 IMPLANT
PACK BASIN DAY SURGERY FS (CUSTOM PROCEDURE TRAY) ×2 IMPLANT
PAD CAST 3X4 CTTN HI CHSV (CAST SUPPLIES) IMPLANT
PAD CAST 4YDX4 CTTN HI CHSV (CAST SUPPLIES) ×2 IMPLANT
PADDING CAST ABS 4INX4YD NS (CAST SUPPLIES)
PADDING CAST ABS COTTON 4X4 ST (CAST SUPPLIES) IMPLANT
PADDING CAST COTTON 3X4 STRL (CAST SUPPLIES)
PADDING CAST COTTON 4X4 STRL (CAST SUPPLIES) ×4
PADDING CAST COTTON 6X4 STRL (CAST SUPPLIES) IMPLANT
PADDING CAST SYN 6 (CAST SUPPLIES)
PADDING CAST SYNTHETIC 4 (CAST SUPPLIES)
PADDING CAST SYNTHETIC 4X4 STR (CAST SUPPLIES) IMPLANT
PADDING CAST SYNTHETIC 6X4 NS (CAST SUPPLIES) IMPLANT
PEG LOCKING SMOOTH 2.2X14 (Peg) ×2 IMPLANT
PENCIL SMOKE EVACUATOR (MISCELLANEOUS) ×2 IMPLANT
PLATE LONG DVR LEFT (Plate) ×2 IMPLANT
SCREW LOCK 14X2.7X 3 LD TPR (Screw) ×4 IMPLANT
SCREW LOCK 16X2.7X 3 LD TPR (Screw) ×1 IMPLANT
SCREW LOCK 18X2.7X 3 LD TPR (Screw) ×1 IMPLANT
SCREW LOCKING 2.7X13MM (Screw) ×2 IMPLANT
SCREW LOCKING 2.7X14 (Screw) ×8 IMPLANT
SCREW LOCKING 2.7X15MM (Screw) ×6 IMPLANT
SCREW LOCKING 2.7X16 (Screw) ×2 IMPLANT
SCREW LOCKING 2.7X18 (Screw) ×2 IMPLANT
SHEET MEDIUM DRAPE 40X70 STRL (DRAPES) ×2 IMPLANT
SLEEVE SCD COMPRESS KNEE MED (STOCKING) ×2 IMPLANT
SLING ARM FOAM STRAP LRG (SOFTGOODS) ×2 IMPLANT
SPLINT FAST PLASTER 5X30 (CAST SUPPLIES)
SPLINT FIBERGLASS 3X35 (CAST SUPPLIES) ×2 IMPLANT
SPLINT FIBERGLASS 4X30 (CAST SUPPLIES) IMPLANT
SPLINT PLASTER CAST FAST 5X30 (CAST SUPPLIES) IMPLANT
SPONGE LAP 18X18 RF (DISPOSABLE) IMPLANT
STAPLER VISISTAT (STAPLE) IMPLANT
STOCKINETTE 4X48 STRL (DRAPES) ×2 IMPLANT
STRIP CLOSURE SKIN 1/2X4 (GAUZE/BANDAGES/DRESSINGS) IMPLANT
STRIP CLOSURE SKIN 1/4X4 (GAUZE/BANDAGES/DRESSINGS) IMPLANT
SUCTION FRAZIER HANDLE 10FR (MISCELLANEOUS) ×1
SUCTION TUBE FRAZIER 10FR DISP (MISCELLANEOUS) ×1 IMPLANT
SUT ETHILON 3 0 PS 1 (SUTURE) ×6 IMPLANT
SUT MNCRL AB 4-0 PS2 18 (SUTURE) IMPLANT
SUT VIC AB 0 CT1 27 (SUTURE) ×2
SUT VIC AB 0 CT1 27XBRD ANBCTR (SUTURE) ×1 IMPLANT
SUT VIC AB 2-0 CT1 27 (SUTURE) ×2
SUT VIC AB 2-0 CT1 TAPERPNT 27 (SUTURE) ×1 IMPLANT
SUT VIC AB 3-0 SH 27 (SUTURE) ×8
SUT VIC AB 3-0 SH 27X BRD (SUTURE) ×4 IMPLANT
SYR BULB EAR ULCER 3OZ GRN STR (SYRINGE) ×2 IMPLANT
SYR CONTROL 10ML LL (SYRINGE) IMPLANT
TOWEL GREEN STERILE FF (TOWEL DISPOSABLE) ×4 IMPLANT
TUBE CONNECTING 20X1/4 (TUBING) ×2 IMPLANT
UNDERPAD 30X36 HEAVY ABSORB (UNDERPADS AND DIAPERS) ×2 IMPLANT
YANKAUER SUCT BULB TIP NO VENT (SUCTIONS) ×2 IMPLANT

## 2020-08-13 NOTE — Anesthesia Postprocedure Evaluation (Signed)
Anesthesia Post Note  Patient: Kevin Montoya  Procedure(s) Performed: REVISION OPEN REDUCTION INTERNAL FIXATION (ORIF) LEFT RADIAL SHAFT FRACTURE WITH REMOVAL OF HARDWARE, RESECTION ULNAR HEAD (Left Arm Lower) OPEN REDUCTION INTERNAL FIXATION (ORIF) RADIAL FRACTURE (Left Arm Lower) HARDWARE REMOVAL LEFT WRIST (Left Arm Lower)     Patient location during evaluation: PACU Anesthesia Type: General Level of consciousness: awake and alert and oriented Pain management: pain level controlled Vital Signs Assessment: post-procedure vital signs reviewed and stable Respiratory status: spontaneous breathing, nonlabored ventilation and respiratory function stable Cardiovascular status: blood pressure returned to baseline and stable Postop Assessment: no apparent nausea or vomiting Anesthetic complications: no   No complications documented.  Last Vitals:  Vitals:   08/13/20 1600 08/13/20 1605  BP: 139/89   Pulse: 83   Resp: 13   Temp:    SpO2: 100% 97%    Last Pain:  Vitals:   08/13/20 1551  TempSrc:   PainSc: Asleep                 Auda Finfrock A.

## 2020-08-13 NOTE — Progress Notes (Signed)
Assisted Dr. Rob Fitzgerald with left, ultrasound guided, supraclavicular block. Side rails up, monitors on throughout procedure. See vital signs in flow sheet. Tolerated Procedure well. 

## 2020-08-13 NOTE — Anesthesia Procedure Notes (Signed)
Procedure Name: LMA Insertion Performed by: Ziara Thelander, [place], CRNA Pre-anesthesia Checklist: Patient identified, Emergency Drugs available, Suction available and Patient being monitored Patient Re-evaluated:Patient Re-evaluated prior to induction Oxygen Delivery Method: Circle system utilized Preoxygenation: Pre-oxygenation with 100% oxygen Induction Type: IV induction Ventilation: Mask ventilation without difficulty LMA: LMA inserted LMA Size: 5.0 Number of attempts: 1 Airway Equipment and Method: Bite block Placement Confirmation: positive ETCO2 Tube secured with: Tape Dental Injury: Teeth and Oropharynx as per pre-operative assessment        

## 2020-08-13 NOTE — Anesthesia Preprocedure Evaluation (Addendum)
Anesthesia Evaluation  Patient identified by MRN, date of birth, ID band Patient awake    Reviewed: Allergy & Precautions, NPO status , Patient's Chart, lab work & pertinent test results  Airway Mallampati: IV  TM Distance: >3 FB Neck ROM: Full  Mouth opening: Limited Mouth Opening  Dental  (+) Dental Advisory Given   Pulmonary neg pulmonary ROS,    breath sounds clear to auscultation       Cardiovascular hypertension, Pt. on medications  Rhythm:Regular Rate:Normal     Neuro/Psych Anxiety negative neurological ROS     GI/Hepatic Neg liver ROS, GERD  ,  Endo/Other  negative endocrine ROS  Renal/GU negative Renal ROS     Musculoskeletal   Abdominal   Peds  Hematology negative hematology ROS (+)   Anesthesia Other Findings   Reproductive/Obstetrics                           No results found for: WBC, HGB, HCT, MCV, PLT No results found for: CREATININE, BUN, NA, K, CL, CO2  Anesthesia Physical Anesthesia Plan  ASA: II  Anesthesia Plan: General   Post-op Pain Management:  Regional for Post-op pain   Induction: Intravenous  PONV Risk Score and Plan: 2 and Dexamethasone, Ondansetron and Treatment may vary due to age or medical condition  Airway Management Planned: LMA  Additional Equipment:   Intra-op Plan:   Post-operative Plan: Extubation in OR  Informed Consent: I have reviewed the patients History and Physical, chart, labs and discussed the procedure including the risks, benefits and alternatives for the proposed anesthesia with the patient or authorized representative who has indicated his/her understanding and acceptance.     Dental advisory given  Plan Discussed with: CRNA  Anesthesia Plan Comments: (Discussed case with Dr. Roda Shutters. Case supposed to take 1.5 hours as opposed to the 3+ it is posted for. Will plan on LMA like previous anesthetic and glidescope intubation as a  back up.)       Anesthesia Quick Evaluation

## 2020-08-13 NOTE — Transfer of Care (Signed)
Immediate Anesthesia Transfer of Care Note  Patient: Carlson Belland Avilla  Procedure(s) Performed: REVISION OPEN REDUCTION INTERNAL FIXATION (ORIF) LEFT RADIAL SHAFT FRACTURE WITH REMOVAL OF HARDWARE, RESECTION ULNAR HEAD (Left Arm Lower) OPEN REDUCTION INTERNAL FIXATION (ORIF) RADIAL FRACTURE (Left Arm Lower) HARDWARE REMOVAL LEFT WRIST (Left Arm Lower)  Patient Location: PACU  Anesthesia Type:GA combined with regional for post-op pain  Level of Consciousness: awake, alert  and oriented  Airway & Oxygen Therapy: Patient Spontanous Breathing and Patient connected to face mask oxygen  Post-op Assessment: Report given to RN and Post -op Vital signs reviewed and stable  Post vital signs: Reviewed and stable  Last Vitals:  Vitals Value Taken Time  BP 157/122 08/13/20 1458  Temp 36.5 C 08/13/20 1458  Pulse 92 08/13/20 1500  Resp 13 08/13/20 1500  SpO2 97 % 08/13/20 1500  Vitals shown include unvalidated device data.  Last Pain:  Vitals:   08/13/20 1032  TempSrc: Oral  PainSc: 2       Patients Stated Pain Goal: 4 (08/13/20 1032)  Complications: No complications documented.

## 2020-08-13 NOTE — H&P (Signed)
PREOPERATIVE H&P  Chief Complaint: left peri-implant radial shaft fracture and ulnar styloid fracture  HPI: Kevin Montoya is a 65 y.o. male who presents for surgical treatment of left peri-implant radial shaft fracture and ulnar styloid fracture.  He denies any changes in medical history.  Past Medical History:  Diagnosis Date  . Blind   . GERD (gastroesophageal reflux disease)   . Hypertension   . Left wrist fracture   . PTSD (post-traumatic stress disorder)    related to medical procedures done as a child.   . Severe needle phobia    Past Surgical History:  Procedure Laterality Date  . CARPAL TUNNEL RELEASE Left 12/01/2019   Procedure: CARPAL TUNNEL RELEASE;  Surgeon: Tarry Kos, MD;  Location: [facility];  Service: Orthopedics;  Laterality: Left;  . extra finger removal     as child  . OPEN REDUCTION INTERNAL FIXATION (ORIF) DISTAL RADIAL FRACTURE Left 12/01/2019   Procedure: OPEN REDUCTION INTERNAL FIXATION (ORIF) LEFT DISTAL RADIUS FRACTURE;  Surgeon: Tarry Kos, MD;  Location: [facility];  Service: Orthopedics;  Laterality: Left;   Social History   Socioeconomic History  . Marital status: Married    Spouse name: Not on file  . Number of children: Not on file  . Years of education: Not on file  . Highest education level: Not on file  Occupational History  . Not on file  Tobacco Use  . Smoking status: Never Smoker  . Smokeless tobacco: Never Used  Substance and Sexual Activity  . Alcohol use: Yes    Comment: occais  . Drug use: Never  . Sexual activity: Not on file  Other Topics Concern  . Not on file  Social History Narrative  . Not on file   Social Determinants of Health   Financial Resource Strain: Not on file  Food Insecurity: Not on file  Transportation Needs: Not on file  Physical Activity: Not on file  Stress: Not on file  Social Connections: Not on file   History reviewed. No pertinent family  history. Allergies  Allergen Reactions  . Penicillins Rash    As a child   Prior to Admission medications   Medication Sig Start Date End Date Taking? Authorizing Provider  amLODipine (NORVASC) 5 MG tablet Take 5 mg by mouth daily.   Yes [provider]  Ascorbic Acid (VITAMIN C) 500 MG CHEW Chew 1 tablet (500 mg total) by mouth 2 (two) times daily. 12/01/19  Yes Tarry Kos, MD  ibuprofen (ADVIL) 800 MG tablet Take 1 tablet (800 mg total) by mouth every 8 (eight) hours as needed. 08/09/20  Yes Cristie Hem, PA-C  omeprazole (PRILOSEC) 20 MG capsule Take 20 mg by mouth daily.   Yes [provider]  oxyCODONE-acetaminophen (PERCOCET) 5-325 MG tablet Take 1/2-1 tab po tid prn pain 08/03/20  Yes Jari Sportsman L, PA-C  ondansetron (ZOFRAN) 4 MG tablet Take 1 tablet (4 mg total) by mouth every 8 (eight) hours as needed for nausea or vomiting. 08/03/20   Cristie Hem, PA-C     Positive ROS: All other systems have been reviewed and were otherwise negative with the exception of those mentioned in the HPI and as above.  Physical Exam: General: Alert, no acute distress Cardiovascular: No pedal edema Respiratory: No cyanosis, no use of accessory musculature GI: abdomen soft Skin: No lesions in the area of chief complaint Neurologic: Sensation intact distally Psychiatric: Patient is competent for  consent with normal mood and affect Lymphatic: no lymphedema  MUSCULOSKELETAL: exam stable  Assessment: left peri-implant radial shaft fracture and ulnar styloid fracture  Plan: Plan for Procedure(s): REVISION OPEN REDUCTION INTERNAL FIXATION (ORIF) LEFT RADIAL SHAFT FRACTURE WITH REMOVAL OF HARDWARE, OPEN REDUCTION INTERNAL FIXATION LEFT DISTAL ULNAR FRACTURE OPEN REDUCTION INTERNAL FIXATION (ORIF) RADIAL FRACTURE HARDWARE REMOVAL  The risks benefits and alternatives were discussed with the patient including but not limited to the risks of nonoperative treatment, versus  surgical intervention including infection, bleeding, nerve injury,  blood clots, cardiopulmonary complications, morbidity, mortality, among others, and they were willing to proceed.   Preoperative templating of the joint replacement has been completed, documented, and submitted to the Operating Room personnel in order to optimize intra-operative equipment management.   Glee Arvin, MD 08/13/2020 11:28 AM

## 2020-08-13 NOTE — Discharge Instructions (Signed)
Next dose of Tylenol can be given after 5:30PM. Next dose of  NSAID (Ibuprofen, Motrin, Aleve) can be given after 5:30PM.    Regional Anesthesia Blocks  1. Numbness or the inability to move the "blocked" extremity may last from 3-48 hours after placement. The length of time depends on the medication injected and your individual response to the medication. If the numbness is not going away after 48 hours, call your surgeon.  2. The extremity that is blocked will need to be protected until the numbness is gone and the  Strength has returned. Because you cannot feel it, you will need to take extra care to avoid injury. Because it may be weak, you may have difficulty moving it or using it. You may not know what position it is in without looking at it while the block is in effect.  3. For blocks in the legs and feet, returning to weight bearing and walking needs to be done carefully. You will need to wait until the numbness is entirely gone and the strength has returned. You should be able to move your leg and foot normally before you try and bear weight or walk. You will need someone to be with you when you first try to ensure you do not fall and possibly risk injury.  4. Bruising and tenderness at the needle site are common side effects and will resolve in a few days.  5. Persistent numbness or new problems with movement should be communicated to the surgeon or the [facility] [phone] [facility] [phone]).     Post Anesthesia Home Care Instructions  Activity: Get plenty of rest for the remainder of the day. A responsible individual must stay with you for 24 hours following the procedure.  For the next 24 hours, DO NOT: -Drive a car -Advertising copywriter -Drink alcoholic beverages -Take any medication unless instructed by your physician -Make any legal decisions or sign important papers.  Meals: Start with liquid foods such as gelatin or soup. Progress  to regular foods as tolerated. Avoid greasy, spicy, heavy foods. If nausea and/or vomiting occur, drink only clear liquids until the nausea and/or vomiting subsides. Call your physician if vomiting continues.  Special Instructions/Symptoms: Your throat may feel dry or sore from the anesthesia or the breathing tube placed in your throat during surgery. If this causes discomfort, gargle with warm salt water. The discomfort should disappear within 24 hours.  If you had a scopolamine patch placed behind your ear for the management of post- operative nausea and/or vomiting:  1. The medication in the patch is effective for 72 hours, after which it should be removed.  Wrap patch in a tissue and discard in the trash. Wash hands thoroughly with soap and water. 2. You may remove the patch earlier than 72 hours if you experience unpleasant side effects which may include dry mouth, dizziness or visual disturbances. 3. Avoid touching the patch. Wash your hands with soap and water after contact with the patch.           Postoperative instructions:  Dressing instructions: Keep your dressing and/or splint clean and dry at all times.  It will be removed at your first post-operative appointment.  Your stitches and/or staples will be removed at this visit.  Incision instructions:  Do not soak your incision for 3 weeks after surgery.  If the incision gets wet, pat dry and do not scrub the incision.  Pain control:  You have been given  a prescription to be taken as directed for post-operative pain control.  In addition, elevate the operative extremity above the heart at all times to prevent swelling and throbbing pain.  Take over-the-counter Colace, 100mg  by mouth twice a day while taking narcotic pain medications to help prevent constipation.  Follow up appointments: 1) 14 days for suture removal and wound check. 2) Dr. as scheduled.    -------------------------------------------------------------------------------------------------------------  After Surgery Pain Control:  After your surgery, post-surgical discomfort or pain is likely. This discomfort can last several days to a few weeks. At certain times of the day your discomfort may be more intense.  Did you receive a nerve block?  A nerve block can provide pain relief for one hour to two days after your surgery. As long as the nerve block is working, you will experience little or no sensation in the area the surgeon operated on.  As the nerve block wears off, you will begin to experience pain or discomfort. It is very important that you begin taking your prescribed pain medication before the nerve block fully wears off. Treating your pain at the first sign of the block wearing off will ensure your pain is better controlled and more tolerable when full-sensation returns. Do not wait until the pain is intolerable, as the medicine will be less effective. It is better to treat pain in advance than to try and catch up.  General Anesthesia:  If you did not receive a nerve block during your surgery, you will need to start taking your pain medication shortly after your surgery and should continue to do so as prescribed by your surgeon.  Pain Medication:  Most commonly we prescribe Vicodin and Percocet for post-operative pain. Both of these medications contain a combination of acetaminophen (Tylenol) and a narcotic to help control pain.   It takes between 30 and 45 minutes before pain medication starts to work. It is important to take your medication before your pain level gets too intense.   Nausea is a common side effect of many pain medications. You will want to eat something before taking your pain medicine to help prevent nausea.   If you are taking a prescription pain medication that contains acetaminophen, we recommend that you do not take additional over the counter  acetaminophen (Tylenol).  Other pain relieving options:   Using a cold pack to ice the affected area a few times a day (15 to 20 minutes at a time) can help to relieve pain, reduce swelling and bruising.   Elevation of the affected area can also help to reduce pain and swelling.

## 2020-08-13 NOTE — Op Note (Addendum)
Date of Surgery: 08/13/2020  INDICATIONS: Kevin Montoya is a 65 y.o.-year-old male with a left radial shaft and ulnar head fracture as a result of a mechanical fall about a week and a half ago.  He underwent ORIF of a left distal radius fracture about 9 months ago he unfortunately broke his radial shaft above the proximal end of the plate.  The patient did consent to the procedure after discussion of the risks and benefits.  PREOPERATIVE DIAGNOSIS:  1. Left radius peri-implant fracture 2. Left distal ulnar head fracture  POSTOPERATIVE DIAGNOSIS: Same.  PROCEDURE:  1. Removal of left distal radius plate and associated screws 2. ORIF of left radial shaft fracture 3. Partial excision of left distal ulna  SURGEON: Kevin Montoya, M.D.  ASSIST: Kevin Montoya, [place]; necessary for the timely completion of procedure and due to complexity of procedure.  ANESTHESIA:  general, supraclavicular block  IV FLUIDS AND URINE: See anesthesia.  ESTIMATED BLOOD LOSS: minimal mL.  IMPLANTS: Biomet DVR extended plate  DRAINS: none  COMPLICATIONS: see description of procedure.  DESCRIPTION OF PROCEDURE: The patient was brought to the operating room.  The patient had been signed prior to the procedure and this was documented. The patient had the anesthesia placed by the anesthesiologist.  A time-out was performed to confirm that this was the correct patient, site, side and location. The patient did receive antibiotics prior to the incision and was re-dosed during the procedure as needed at indicated intervals.  A tourniquet was placed.  The patient had the operative extremity prepped and draped in the standard surgical fashion.    The previous surgical scar was incised and extended proximally.  Blunt dissection was carried down through the scar tissue and through the interval between the FCR and radial artery.  The FCR was swept ulnarly.  The FPL was swept radially.  The fracture was exposed.  The  fracture was significantly short.  The brachial radialis was released under direct visualization.  Soft tissue was cleared off of the previous distal radius plate and screws.  Once we have visualization the plate and screws were removed without difficulty.  We then obtained a reduction of the fracture by using reduction clamps as well as rotation and traction.  Once we obtained a reduction a longer distal radius DVR plate was placed on the volar surface.  Bicortical screws were placed through the distal portion of the plate each with excellent fixation.  We then confirmed anatomic reduction of the fracture and placed bicortical screws through the proximal portion of the plate into the radial shaft.  Each screw had excellent fixation.  Radial bow was restored.  Fluoroscopy was used to check appropriate reduction and hardware placement.  We then made a separate incision over the distal ulna.  Dissection was carried down through the subcutaneous tissue.  Cutaneous nerves were identified and swept aside.  The retinaculum was incised and the ECU tendon was intact.  The distal ulna fracture was first visualized.  The main fragment had little remaining soft tissue attachment on the volar side.  I attempted to reduce the fragment back to the ulna but after multiple failed attempts I did not feel that I could place adequate fixation therefore the decision was made to excise the portion of the ulnar head that was fractured off.  Final x-rays were taken.  DRUJ was not compromised by excising the distal ulnar fragment.  Surgical incisions were thoroughly irrigated and closed in layered fashion with 3-0  Vicryl and 3-0 nylon.  Sterile dressings were applied.  Sugar tong splint was placed.  Patient tolerated the procedure well had no immediate complications.  Kevin Montoya was necessary for opening, closing, retracting, limb positioning and overall facilitation and timely completion of the procedure.  POSTOPERATIVE PLAN:  Patient will be discharged home and follow-up in the office in 2 weeks for suture removal, splint removal, repeat x-rays of the left forearm and wrist.  Kevin Reel, MD 2:26 PM

## 2020-08-17 ENCOUNTER — Encounter (HOSPITAL_BASED_OUTPATIENT_CLINIC_OR_DEPARTMENT_OTHER): Payer: Self-pay | Admitting: Orthopaedic Surgery

## 2020-08-27 ENCOUNTER — Encounter: Payer: Self-pay | Admitting: Orthopaedic Surgery

## 2020-08-27 ENCOUNTER — Ambulatory Visit (INDEPENDENT_AMBULATORY_CARE_PROVIDER_SITE_OTHER): Payer: PPO

## 2020-08-27 ENCOUNTER — Ambulatory Visit (INDEPENDENT_AMBULATORY_CARE_PROVIDER_SITE_OTHER): Payer: PPO | Admitting: Orthopaedic Surgery

## 2020-08-27 ENCOUNTER — Other Ambulatory Visit: Payer: Self-pay

## 2020-08-27 VITALS — Ht 70.5 in | Wt 253.0 lb

## 2020-08-27 DIAGNOSIS — S52202A Unspecified fracture of shaft of left ulna, initial encounter for closed fracture: Secondary | ICD-10-CM | POA: Diagnosis not present

## 2020-08-27 DIAGNOSIS — S52302A Unspecified fracture of shaft of left radius, initial encounter for closed fracture: Secondary | ICD-10-CM

## 2020-08-27 NOTE — Progress Notes (Signed)
Post-Op Visit Note   Patient: Kevin Montoya           Date of Birth: Aug 18, 1955           MRN: [id number] Visit Date: 08/27/2020 PCP: Patient, No Pcp Per (Inactive)   Assessment & Plan:  Chief Complaint:  Chief Complaint  Patient presents with  . Left Arm - Follow-up    ORIF revision of radial shaft fracture with hardware removal with ulnar head resection 08/13/2020   Visit Diagnoses:  1. Fracture of radial shaft with ulna, closed, left, initial encounter     Plan:   Kevin Montoya is 2 weeks status post revision ORIF right radius and distal ulna resection on 08/13/2020.Marland Kitchen  He is overall doing well reports pain.  Takes Percocet as needed.  Left forearm shows fully healed surgical incisions.  Sutures intact.  Mild swelling.  No neurovascular compromise.  No signs of infection.  X-rays are unremarkable.  Fixation is stable without complications.  DRUJ stable.  At this point we will remove the sutures and placed him in a long-arm removable wrist brace.  Patient decided against a short arm cast.  Continue nonweightbearing.  Follow-up in 4 weeks with two-view x-rays of the left wrist.  Follow-Up Instructions: Return in about 4 weeks (around 09/24/2020).   Orders:  Orders Placed This Encounter  Procedures  . XR Wrist Complete Left   No orders of the defined types were placed in this encounter.   Imaging: XR Wrist Complete Left  Result Date: 08/27/2020 Stable alignment and fixation of radial shaft fracture without hardware complications.  Status post distal ulna resection.  DRUJ intact.   PMFS History: Patient Active Problem List   Diagnosis Date Noted  . Fracture of radial shaft with ulna, closed, left, initial encounter 08/13/2020   Past Medical History:  Diagnosis Date  . Blind   . GERD (gastroesophageal reflux disease)   . Hypertension   . Left wrist fracture   . PTSD (post-traumatic stress disorder)    related to medical procedures done as a child.   . Severe needle phobia      History reviewed. No pertinent family history.  Past Surgical History:  Procedure Laterality Date  . CARPAL TUNNEL RELEASE Left 12/01/2019   Procedure: CARPAL TUNNEL RELEASE;  Surgeon: Tarry Kos, MD;  Location: [facility];  Service: Orthopedics;  Laterality: Left;  . extra finger removal     as child  . HARDWARE REMOVAL Left 08/13/2020   Procedure: HARDWARE REMOVAL LEFT WRIST;  Surgeon: Tarry Kos, MD;  Location: [facility];  Service: Orthopedics;  Laterality: Left;  . OPEN REDUCTION INTERNAL FIXATION (ORIF) DISTAL RADIAL FRACTURE Left 12/01/2019   Procedure: OPEN REDUCTION INTERNAL FIXATION (ORIF) LEFT DISTAL RADIUS FRACTURE;  Surgeon: Tarry Kos, MD;  Location: [facility];  Service: Orthopedics;  Laterality: Left;  . ORIF RADIAL FRACTURE Left 08/13/2020   Procedure: OPEN REDUCTION INTERNAL FIXATION (ORIF) RADIAL FRACTURE;  Surgeon: Tarry Kos, MD;  Location:  SUR[facility]ERY C[facility];  Service: Orthopedics;  Laterality: Left;  . ULNAR HEAD EXCISION Left 08/13/2020   Procedure: REVISION OPEN REDUCTION INTERNAL FIXATION (ORIF) LEFT RADIAL SHAFT FRACTURE WITH REMOVAL OF HARDWARE, RESECTION ULNAR HEAD;  Surgeon: Tarry Kos, MD;  Location:  SURGERY CENTER;  Service: Orthopedics;  Laterality: Left;   Social History   Occupational History  . Not on file  Tobacco Use  . Smoking status: Never Smoker  . Smokeless tobacco:  Never Used  Substance and Sexual Activity  . Alcohol use: Yes    Comment: occais  . Drug use: Never  . Sexual activity: Not on file

## 2020-08-30 ENCOUNTER — Telehealth: Payer: Self-pay

## 2020-08-30 NOTE — Telephone Encounter (Signed)
pts wife called asking if he can take his brace off to use his squeeze ball.  Please advise.

## 2020-08-30 NOTE — Telephone Encounter (Signed)
Yes that's fine 

## 2020-09-01 NOTE — Telephone Encounter (Signed)
Called patient. Answered first time. Was not able to hear me. Called back again went to VM. Left details below.

## 2020-09-24 ENCOUNTER — Ambulatory Visit (INDEPENDENT_AMBULATORY_CARE_PROVIDER_SITE_OTHER): Payer: PPO | Admitting: Orthopaedic Surgery

## 2020-09-24 ENCOUNTER — Encounter: Payer: Self-pay | Admitting: Orthopaedic Surgery

## 2020-09-24 ENCOUNTER — Ambulatory Visit: Payer: Self-pay

## 2020-09-24 VITALS — Ht 70.5 in | Wt 253.0 lb

## 2020-09-24 DIAGNOSIS — S52202A Unspecified fracture of shaft of left ulna, initial encounter for closed fracture: Secondary | ICD-10-CM

## 2020-09-24 DIAGNOSIS — S52302A Unspecified fracture of shaft of left radius, initial encounter for closed fracture: Secondary | ICD-10-CM

## 2020-09-24 NOTE — Addendum Note (Signed)
Addended by: Albertina Parr on: 09/24/2020 01:54 PM   Modules accepted: Orders

## 2020-09-24 NOTE — Progress Notes (Signed)
Post-Op Visit Note   Patient: Kevin Montoya           Date of Birth: [birth year]           MRN: [id number] Visit Date: 09/24/2020 PCP: Patient, No Pcp Per (Inactive)   Assessment & Plan:  Chief Complaint:  Chief Complaint  Patient presents with  . Left Wrist - Follow-up    ORIF Left wrist 08/13/2020   Visit Diagnoses:  1. Fracture of radial shaft with ulna, closed, left, initial encounter     Plan:   Kevin Montoya is a 6 weeks status post revision ORIF left wrist fracture.  He is doing well and has no complaints.  Left wrist and forearm show a fully healed surgical scar.  No signs of infection.  He has no tenderness over the distal ulna.  Limited range of motion of the wrist that is expected from prolonged immobilization.  X-rays are unremarkable and without complication.  At this point we will make a referral to the hand center for hand and wrist rehabilitation.  He may wean the wrist brace as tolerated.  He may weight-bear as tolerated as well.  Recheck in 6 weeks with 2 view x-rays of the left wrist.  Follow-Up Instructions: Return in about 6 weeks (around 11/05/2020).   Orders:  Orders Placed This Encounter  Procedures  . XR Wrist 2 Views Left   No orders of the defined types were placed in this encounter.   Imaging: XR Wrist 2 Views Left  Result Date: 09/24/2020 Stable fixation and alignment fracture.  Fracture is demonstrating healing and consolidation.   PMFS History: Patient Active Problem List   Diagnosis Date Noted  . Fracture of radial shaft with ulna, closed, left, initial encounter 08/13/2020   Past Medical History:  Diagnosis Date  . Blind   . GERD (gastroesophageal reflux disease)   . Hypertension   . Left wrist fracture   . PTSD (post-traumatic stress disorder)    related to medical procedures done as a child.   . Severe needle phobia     History reviewed. No pertinent family history.  Past Surgical History:  Procedure Laterality Date  . CARPAL  TUNNEL RELEASE Left 12/01/2019   Procedure: CARPAL TUNNEL RELEASE;  Surgeon: Tarry Kos, MD;  Location: [facility];  Service: Orthopedics;  Laterality: Left;  . extra finger removal     as child  . HARDWARE REMOVAL Left 08/13/2020   Procedure: HARDWARE REMOVAL LEFT WRIST;  Surgeon: Tarry Kos, MD;  Location: [facility];  Service: Orthopedics;  Laterality: Left;  . OPEN REDUCTION INTERNAL FIXATION (ORIF) DISTAL RADIAL FRACTURE Left 12/01/2019   Procedure: OPEN REDUCTION INTERNAL FIXATION (ORIF) LEFT DISTAL RADIUS FRACTURE;  Surgeon: Tarry Kos, MD;  Location: [facility];  Service: Orthopedics;  Laterality: Left;  . ORIF RADIAL FRACTURE Left 08/13/2020   Procedure: OPEN REDUCTION INTERNAL FIXATION (ORIF) RADIAL FRACTURE;  Surgeon: Tarry Kos, MD;  Location: [facility];  Service: Orthopedics;  Laterality: Left;  . ULNAR HEAD EXCISION Left 08/13/2020   Procedure: REVISION OPEN REDUCTION INTERNAL FIXATION (ORIF) LEFT RADIAL SHAFT FRACTURE WITH REMOVAL OF HARDWARE, RESECTION ULNAR HEAD;  Surgeon: Tarry Kos, MD;  Location: [facility];  Service: Orthopedics;  Laterality: Left;   Social History   Occupational History  . Not on file  Tobacco Use  . Smoking status: Never Smoker  . Smokeless tobacco: Never Used  Substance and Sexual Activity  .  Alcohol use: Yes    Comment: occais  . Drug use: Never  . Sexual activity: Not on file

## 2020-10-11 DIAGNOSIS — M25632 Stiffness of left wrist, not elsewhere classified: Secondary | ICD-10-CM | POA: Diagnosis not present

## 2020-10-11 DIAGNOSIS — M25532 Pain in left wrist: Secondary | ICD-10-CM | POA: Diagnosis not present

## 2020-10-13 DIAGNOSIS — M25532 Pain in left wrist: Secondary | ICD-10-CM | POA: Diagnosis not present

## 2020-10-13 DIAGNOSIS — M25632 Stiffness of left wrist, not elsewhere classified: Secondary | ICD-10-CM | POA: Diagnosis not present

## 2020-10-19 DIAGNOSIS — M25632 Stiffness of left wrist, not elsewhere classified: Secondary | ICD-10-CM | POA: Diagnosis not present

## 2020-10-19 DIAGNOSIS — M25532 Pain in left wrist: Secondary | ICD-10-CM | POA: Diagnosis not present

## 2020-10-21 DIAGNOSIS — M25532 Pain in left wrist: Secondary | ICD-10-CM | POA: Diagnosis not present

## 2020-10-21 DIAGNOSIS — M25632 Stiffness of left wrist, not elsewhere classified: Secondary | ICD-10-CM | POA: Diagnosis not present

## 2020-10-25 DIAGNOSIS — M25532 Pain in left wrist: Secondary | ICD-10-CM | POA: Diagnosis not present

## 2020-10-25 DIAGNOSIS — M25632 Stiffness of left wrist, not elsewhere classified: Secondary | ICD-10-CM | POA: Diagnosis not present

## 2020-10-28 DIAGNOSIS — R29898 Other symptoms and signs involving the musculoskeletal system: Secondary | ICD-10-CM | POA: Diagnosis not present

## 2020-10-28 DIAGNOSIS — M25632 Stiffness of left wrist, not elsewhere classified: Secondary | ICD-10-CM | POA: Diagnosis not present

## 2020-10-28 DIAGNOSIS — M25532 Pain in left wrist: Secondary | ICD-10-CM | POA: Diagnosis not present

## 2020-11-01 DIAGNOSIS — M25532 Pain in left wrist: Secondary | ICD-10-CM | POA: Diagnosis not present

## 2020-11-01 DIAGNOSIS — R29898 Other symptoms and signs involving the musculoskeletal system: Secondary | ICD-10-CM | POA: Diagnosis not present

## 2020-11-01 DIAGNOSIS — M25632 Stiffness of left wrist, not elsewhere classified: Secondary | ICD-10-CM | POA: Diagnosis not present

## 2020-11-05 ENCOUNTER — Encounter: Payer: PPO | Admitting: Orthopaedic Surgery

## 2020-11-09 ENCOUNTER — Other Ambulatory Visit: Payer: Self-pay

## 2020-11-09 ENCOUNTER — Ambulatory Visit (HOSPITAL_COMMUNITY)
Admission: RE | Admit: 2020-11-09 | Discharge: 2020-11-09 | Disposition: A | Discharge status: Home / Self Care | Payer: PPO | Source: Ambulatory Visit | Attending: Registered Nurse | Admitting: Registered Nurse

## 2020-11-09 ENCOUNTER — Encounter: Payer: Self-pay | Admitting: Orthopaedic Surgery

## 2020-11-09 ENCOUNTER — Ambulatory Visit (INDEPENDENT_AMBULATORY_CARE_PROVIDER_SITE_OTHER): Payer: PPO

## 2020-11-09 ENCOUNTER — Ambulatory Visit (INDEPENDENT_AMBULATORY_CARE_PROVIDER_SITE_OTHER): Payer: PPO | Admitting: Orthopaedic Surgery

## 2020-11-09 ENCOUNTER — Other Ambulatory Visit (HOSPITAL_COMMUNITY): Payer: Self-pay | Admitting: Registered Nurse

## 2020-11-09 DIAGNOSIS — S52302A Unspecified fracture of shaft of left radius, initial encounter for closed fracture: Secondary | ICD-10-CM

## 2020-11-09 DIAGNOSIS — S52202A Unspecified fracture of shaft of left ulna, initial encounter for closed fracture: Secondary | ICD-10-CM

## 2020-11-09 DIAGNOSIS — R21 Rash and other nonspecific skin eruption: Secondary | ICD-10-CM | POA: Diagnosis not present

## 2020-11-09 DIAGNOSIS — M7989 Other specified soft tissue disorders: Secondary | ICD-10-CM

## 2020-11-09 DIAGNOSIS — L03115 Cellulitis of right lower limb: Secondary | ICD-10-CM | POA: Diagnosis not present

## 2020-11-09 DIAGNOSIS — R600 Localized edema: Secondary | ICD-10-CM | POA: Insufficient documentation

## 2020-11-09 DIAGNOSIS — I872 Venous insufficiency (chronic) (peripheral): Secondary | ICD-10-CM | POA: Diagnosis not present

## 2020-11-09 NOTE — Progress Notes (Signed)
Post-Op Visit Note   Patient: Kevin Montoya           Date of Birth: June 11, 1955           MRN: [id number] Visit Date: 11/09/2020 PCP: Patient, No Pcp Per (Inactive)   Assessment & Plan:  Chief Complaint:  Chief Complaint  Patient presents with   Left Wrist - Pain, Routine Post Op   Visit Diagnoses:  1. Fracture of radial shaft with ulna, closed, left, initial encounter     Plan: Shivaan Tierno Brogden is a 65 y.o. male following up 3 months status post revision ORIF left wrist. He is doing well. No significant wrist pain. He has been attending outpatient hand therapy and tolerating this well. His ROM and strength have improved greatly. He had his last appointment with them last week. He takes Tylenol as needed for pain.   Left wrist and forearm exam demonstrate a well healed surgical a scar. No signs of infection. No wrist tenderness. Near normal wrist flexion and extension. 55 degrees of supination, normal pronation. Distal neurovascular exam intact. X-rays demonstrate stable hardware and appropriate healing.  We discussed that his fracture has healed well. At this point he can gradually increase his activities as tolerable. Continue to work on range of motion and strength exercises at home. He no longer needs to attend outpatient physical therapy. We'll see him back as needed.  Follow-Up Instructions: Return if symptoms worsen or fail to improve.   Orders:  Orders Placed This Encounter  Procedures   XR Wrist 2 Views Left   No orders of the defined types were placed in this encounter.   Imaging: Radiographs left wrist dated 11/09/20 were reviewed and demonstrate radial shaft fracture with evidence of consolidation and healing, stable alignment and hardware.   PMFS History: Patient Active Problem List   Diagnosis Date Noted   Fracture of radial shaft with ulna, closed, left, initial encounter 08/13/2020   Past Medical History:  Diagnosis Date   Blind    GERD (gastroesophageal  reflux disease)    Hypertension    Left wrist fracture    PTSD (post-traumatic stress disorder)    related to medical procedures done as a child.    Severe needle phobia     History reviewed. No pertinent family history.  Past Surgical History:  Procedure Laterality Date   CARPAL TUNNEL RELEASE Left 12/01/2019   Procedure: CARPAL TUNNEL RELEASE;  Surgeon: Tarry Kos, MD;  Location: [facility];  Service: Orthopedics;  Laterality: Left;   extra finger removal     as child   HARDWARE REMOVAL Left 08/13/2020   Procedure: HARDWARE REMOVAL LEFT WRIST;  Surgeon: Tarry Kos, MD;  Location: [facility];  Service: Orthopedics;  Laterality: Left;   OPEN REDUCTION INTERNAL FIXATION (ORIF) DISTAL RADIAL FRACTURE Left 12/01/2019   Procedure: OPEN REDUCTION INTERNAL FIXATION (ORIF) LEFT DISTAL RADIUS FRACTURE;  Surgeon: Tarry Kos, MD;  Location: [facility];  Service: Orthopedics;  Laterality: Left;   ORIF RADIAL FRACTURE Left 08/13/2020   Procedure: OPEN REDUCTION INTERNAL FIXATION (ORIF) RADIAL FRACTURE;  Surgeon: Tarry Kos, MD;  Location: [facility];  Service: Orthopedics;  Laterality: Left;   ULNAR HEAD EXCISION Left 08/13/2020   Procedure: REVISION OPEN REDUCTION INTERNAL FIXATION (ORIF) LEFT RADIAL SHAFT FRACTURE WITH REMOVAL OF HARDWARE, RESECTION ULNAR HEAD;  Surgeon: Tarry Kos, MD;  Location: [facility];  Service: Orthopedics;  Laterality: Left;  Social History   Occupational History   Not on file  Tobacco Use   Smoking status: Never   Smokeless tobacco: Never  Substance and Sexual Activity   Alcohol use: Yes    Comment: occais   Drug use: Never   Sexual activity: Not on file

## 2020-12-13 DIAGNOSIS — I872 Venous insufficiency (chronic) (peripheral): Secondary | ICD-10-CM | POA: Diagnosis not present

## 2020-12-13 DIAGNOSIS — E785 Hyperlipidemia, unspecified: Secondary | ICD-10-CM | POA: Diagnosis not present

## 2020-12-13 DIAGNOSIS — E663 Overweight: Secondary | ICD-10-CM | POA: Diagnosis not present

## 2020-12-13 DIAGNOSIS — H3552 Pigmentary retinal dystrophy: Secondary | ICD-10-CM | POA: Diagnosis not present

## 2020-12-13 DIAGNOSIS — K029 Dental caries, unspecified: Secondary | ICD-10-CM | POA: Diagnosis not present

## 2020-12-13 DIAGNOSIS — I10 Essential (primary) hypertension: Secondary | ICD-10-CM | POA: Diagnosis not present

## 2020-12-13 DIAGNOSIS — M7989 Other specified soft tissue disorders: Secondary | ICD-10-CM | POA: Diagnosis not present

## 2020-12-13 DIAGNOSIS — N401 Enlarged prostate with lower urinary tract symptoms: Secondary | ICD-10-CM | POA: Diagnosis not present

## 2021-01-21 DIAGNOSIS — N401 Enlarged prostate with lower urinary tract symptoms: Secondary | ICD-10-CM | POA: Diagnosis not present

## 2021-01-21 DIAGNOSIS — I872 Venous insufficiency (chronic) (peripheral): Secondary | ICD-10-CM | POA: Diagnosis not present

## 2021-01-21 DIAGNOSIS — M7989 Other specified soft tissue disorders: Secondary | ICD-10-CM | POA: Diagnosis not present

## 2021-01-21 DIAGNOSIS — K029 Dental caries, unspecified: Secondary | ICD-10-CM | POA: Diagnosis not present

## 2021-01-21 DIAGNOSIS — Z0000 Encounter for general adult medical examination without abnormal findings: Secondary | ICD-10-CM | POA: Diagnosis not present

## 2021-01-21 DIAGNOSIS — H3552 Pigmentary retinal dystrophy: Secondary | ICD-10-CM | POA: Diagnosis not present

## 2021-01-21 DIAGNOSIS — I10 Essential (primary) hypertension: Secondary | ICD-10-CM | POA: Diagnosis not present

## 2021-01-21 DIAGNOSIS — M25532 Pain in left wrist: Secondary | ICD-10-CM | POA: Diagnosis not present

## 2021-01-21 DIAGNOSIS — E785 Hyperlipidemia, unspecified: Secondary | ICD-10-CM | POA: Diagnosis not present

## 2021-01-21 DIAGNOSIS — E663 Overweight: Secondary | ICD-10-CM | POA: Diagnosis not present

## 2021-01-21 DIAGNOSIS — H543 Unqualified visual loss, both eyes: Secondary | ICD-10-CM | POA: Diagnosis not present

## 2021-11-15 DIAGNOSIS — Z1212 Encounter for screening for malignant neoplasm of rectum: Secondary | ICD-10-CM | POA: Diagnosis not present

## 2021-11-15 DIAGNOSIS — Z1211 Encounter for screening for malignant neoplasm of colon: Secondary | ICD-10-CM | POA: Diagnosis not present

## 2021-12-02 ENCOUNTER — Encounter: Payer: Self-pay | Admitting: Nurse Practitioner

## 2021-12-14 DIAGNOSIS — Z800 Family history of malignant neoplasm of digestive organs: Secondary | ICD-10-CM | POA: Diagnosis not present

## 2021-12-14 DIAGNOSIS — R195 Other fecal abnormalities: Secondary | ICD-10-CM | POA: Diagnosis not present

## 2021-12-14 DIAGNOSIS — F419 Anxiety disorder, unspecified: Secondary | ICD-10-CM | POA: Diagnosis not present

## 2021-12-14 DIAGNOSIS — K219 Gastro-esophageal reflux disease without esophagitis: Secondary | ICD-10-CM | POA: Diagnosis not present

## 2021-12-30 ENCOUNTER — Ambulatory Visit: Payer: PPO | Admitting: Nurse Practitioner

## 2022-01-12 DIAGNOSIS — Z1211 Encounter for screening for malignant neoplasm of colon: Secondary | ICD-10-CM | POA: Diagnosis not present

## 2022-01-12 DIAGNOSIS — D122 Benign neoplasm of ascending colon: Secondary | ICD-10-CM | POA: Diagnosis not present

## 2022-01-12 DIAGNOSIS — K635 Polyp of colon: Secondary | ICD-10-CM | POA: Diagnosis not present

## 2022-01-12 DIAGNOSIS — Z800 Family history of malignant neoplasm of digestive organs: Secondary | ICD-10-CM | POA: Diagnosis not present

## 2022-01-12 DIAGNOSIS — R195 Other fecal abnormalities: Secondary | ICD-10-CM | POA: Diagnosis not present

## 2022-02-07 DIAGNOSIS — N401 Enlarged prostate with lower urinary tract symptoms: Secondary | ICD-10-CM | POA: Diagnosis not present

## 2022-02-07 DIAGNOSIS — H9193 Unspecified hearing loss, bilateral: Secondary | ICD-10-CM | POA: Diagnosis not present

## 2022-02-07 DIAGNOSIS — K029 Dental caries, unspecified: Secondary | ICD-10-CM | POA: Diagnosis not present

## 2022-02-07 DIAGNOSIS — H3552 Pigmentary retinal dystrophy: Secondary | ICD-10-CM | POA: Diagnosis not present

## 2022-02-07 DIAGNOSIS — I872 Venous insufficiency (chronic) (peripheral): Secondary | ICD-10-CM | POA: Diagnosis not present

## 2022-02-07 DIAGNOSIS — H543 Unqualified visual loss, both eyes: Secondary | ICD-10-CM | POA: Diagnosis not present

## 2022-02-07 DIAGNOSIS — Z0000 Encounter for general adult medical examination without abnormal findings: Secondary | ICD-10-CM | POA: Diagnosis not present

## 2022-02-07 DIAGNOSIS — E663 Overweight: Secondary | ICD-10-CM | POA: Diagnosis not present

## 2022-02-07 DIAGNOSIS — M7989 Other specified soft tissue disorders: Secondary | ICD-10-CM | POA: Diagnosis not present

## 2022-02-07 DIAGNOSIS — E785 Hyperlipidemia, unspecified: Secondary | ICD-10-CM | POA: Diagnosis not present

## 2022-02-07 DIAGNOSIS — M25532 Pain in left wrist: Secondary | ICD-10-CM | POA: Diagnosis not present

## 2022-02-07 DIAGNOSIS — I10 Essential (primary) hypertension: Secondary | ICD-10-CM | POA: Diagnosis not present

## 2022-03-24 DIAGNOSIS — R0981 Nasal congestion: Secondary | ICD-10-CM | POA: Diagnosis not present

## 2022-03-24 DIAGNOSIS — I10 Essential (primary) hypertension: Secondary | ICD-10-CM | POA: Diagnosis not present

## 2022-03-24 DIAGNOSIS — H543 Unqualified visual loss, both eyes: Secondary | ICD-10-CM | POA: Diagnosis not present

## 2023-02-12 DIAGNOSIS — M7989 Other specified soft tissue disorders: Secondary | ICD-10-CM | POA: Diagnosis not present

## 2023-02-12 DIAGNOSIS — E663 Overweight: Secondary | ICD-10-CM | POA: Diagnosis not present

## 2023-02-12 DIAGNOSIS — M25532 Pain in left wrist: Secondary | ICD-10-CM | POA: Diagnosis not present

## 2023-02-12 DIAGNOSIS — K635 Polyp of colon: Secondary | ICD-10-CM | POA: Diagnosis not present

## 2023-02-12 DIAGNOSIS — Z0000 Encounter for general adult medical examination without abnormal findings: Secondary | ICD-10-CM | POA: Diagnosis not present

## 2023-02-12 DIAGNOSIS — N401 Enlarged prostate with lower urinary tract symptoms: Secondary | ICD-10-CM | POA: Diagnosis not present

## 2023-02-12 DIAGNOSIS — E785 Hyperlipidemia, unspecified: Secondary | ICD-10-CM | POA: Diagnosis not present

## 2023-02-12 DIAGNOSIS — I872 Venous insufficiency (chronic) (peripheral): Secondary | ICD-10-CM | POA: Diagnosis not present

## 2023-02-12 DIAGNOSIS — I10 Essential (primary) hypertension: Secondary | ICD-10-CM | POA: Diagnosis not present

## 2023-02-12 DIAGNOSIS — H543 Unqualified visual loss, both eyes: Secondary | ICD-10-CM | POA: Diagnosis not present

## 2023-02-12 DIAGNOSIS — H3552 Pigmentary retinal dystrophy: Secondary | ICD-10-CM | POA: Diagnosis not present

## 2023-06-27 DIAGNOSIS — H43393 Other vitreous opacities, bilateral: Secondary | ICD-10-CM | POA: Diagnosis not present

## 2023-08-22 ENCOUNTER — Encounter (INDEPENDENT_AMBULATORY_CARE_PROVIDER_SITE_OTHER): Payer: Self-pay

## 2023-08-22 ENCOUNTER — Ambulatory Visit (INDEPENDENT_AMBULATORY_CARE_PROVIDER_SITE_OTHER)

## 2023-08-22 VITALS — BP 167/102 | HR 81 | Ht 70.0 in | Wt 230.0 lb

## 2023-08-22 DIAGNOSIS — H6123 Impacted cerumen, bilateral: Secondary | ICD-10-CM

## 2023-08-22 DIAGNOSIS — H60502 Unspecified acute noninfective otitis externa, left ear: Secondary | ICD-10-CM

## 2023-08-22 MED ORDER — NEOMYCIN-POLYMYXIN-HC 3.5-10000-1 OT SOLN: 4.0000 [drp] | Freq: Three times a day (TID) | OTIC | 0 refills | Status: AC

## 2023-08-22 NOTE — Progress Notes (Unsigned)
 Dear Dr. Timothy Lasso, Here is my assessment for our mutual patient, Kevin Montoya. Thank you for allowing me the opportunity to care for your patient. Please do not hesitate to contact me should you have any other questions. Sincerely, Burna Forts PA-C  Otolaryngology Clinic Note Referring provider: Dr. Timothy Lasso HPI:  Kevin Montoya is a 68 y.o. male kindly referred by Dr. Timothy Lasso   The patient is a 68 year old gentleman seen in our office for evaluation of bilateral cerumen impaction.  The patient is companied by his mother.  He notes that he has had decreased hearing over the last week secondary to cerumen.  He has had this previous.  He denies any baseline hearing issues.  He denies any pain or drainage.,  He denies any history of ear surgery, trauma, or recurrent ear infections.    Independent Review of Additional Tests or Records:  none   PMH/Meds/All/SocHx/FamHx/ROS:   Past Medical History:  Diagnosis Date   Blind    GERD (gastroesophageal reflux disease)    Hypertension    Left wrist fracture    PTSD (post-traumatic stress disorder)    related to medical procedures done as a child.    Severe needle phobia      Past Surgical History:  Procedure Laterality Date   CARPAL TUNNEL RELEASE Left 12/01/2019   Procedure: CARPAL TUNNEL RELEASE;  Surgeon: Tarry Kos, MD;  Location: [facility];  Service: Orthopedics;  Laterality: Left;   extra finger removal     as child   HARDWARE REMOVAL Left 08/13/2020   Procedure: HARDWARE REMOVAL LEFT WRIST;  Surgeon: Tarry Kos, MD;  Location: [facility];  Service: Orthopedics;  Laterality: Left;   OPEN REDUCTION INTERNAL FIXATION (ORIF) DISTAL RADIAL FRACTURE Left 12/01/2019   Procedure: OPEN REDUCTION INTERNAL FIXATION (ORIF) LEFT DISTAL RADIUS FRACTURE;  Surgeon: Tarry Kos, MD;  Location: [facility];  Service: Orthopedics;  Laterality: Left;   ORIF RADIAL FRACTURE Left 08/13/2020   Procedure: OPEN  REDUCTION INTERNAL FIXATION (ORIF) RADIAL FRACTURE;  Surgeon: Tarry Kos, MD;  Location: [facility];  Service: Orthopedics;  Laterality: Left;   ULNAR HEAD EXCISION Left 08/13/2020   Procedure: REVISION OPEN REDUCTION INTERNAL FIXATION (ORIF) LEFT RADIAL SHAFT FRACTURE WITH REMOVAL OF HARDWARE, RESECTION ULNAR HEAD;  Surgeon: Tarry Kos, MD;  Location: [facility];  Service: Orthopedics;  Laterality: Left;    History reviewed. No pertinent family history.   Social Connections: Not on file      Current Outpatient Medications:    amLODipine (NORVASC) 5 MG tablet, Take 5 mg by mouth daily., Disp: , Rfl:    Ascorbic Acid (VITAMIN C) 500 MG CHEW, Chew 1 tablet (500 mg total) by mouth 2 (two) times daily., Disp: 84 tablet, Rfl: 0   Ascorbic Acid (VITAMIN C) 500 MG CHEW, Chew 1 tablet (500 mg total) by mouth 2 (two) times daily., Disp: 84 tablet, Rfl: 0   calcium-vitamin D (OSCAL WITH D) 500-200 MG-UNIT tablet, Take 1 tablet by mouth 3 (three) times daily., Disp: 90 tablet, Rfl: 6   ibuprofen (ADVIL) 800 MG tablet, Take 1 tablet (800 mg total) by mouth every 8 (eight) hours as needed., Disp: 30 tablet, Rfl: 2   omeprazole (PRILOSEC) 20 MG capsule, Take 20 mg by mouth daily., Disp: , Rfl:    ondansetron (ZOFRAN) 4 MG tablet, Take 1 tablet (4 mg total) by mouth every 8 (eight) hours as needed for nausea or vomiting.,  Disp: 40 tablet, Rfl: 0   oxyCODONE-acetaminophen (PERCOCET) 5-325 MG tablet, Take 1/2-1 tab po tid prn pain, Disp: 30 tablet, Rfl: 0   oxyCODONE-acetaminophen (PERCOCET) 5-325 MG tablet, Take 1-2 tablets by mouth every 8 (eight) hours as needed for severe pain., Disp: 30 tablet, Rfl: 0   Physical Exam:   BP (!) 167/102 (BP Location: Right Arm, Patient Position: Sitting, Cuff Size: Large)   Pulse 81   Ht 5\' 10"  (1.778 m)   Wt 230 lb (104.3 kg)   SpO2 96%   BMI 33.00 kg/m   Pertinent Findings  CN II-XII intact  Bilateral EAC with impacted cerumen  (left EAC narrowed with some swelling, no discharge) Weber 512: equal Rinne 512: AC > BC b/l  Anterior rhinoscopy: Septum midline No lesions of oral cavity/oropharynx No obviously palpable neck masses/lymphadenopathy/thyromegaly No respiratory distress or stridor  Seprately Identifiable Procedures:  Procedure: Bilateral ear microscopy and cerumen removal using microscope (CPT 69210) - Mod 50 Pre-procedure diagnosis:Cerumen impaction bilateral  external ears Post-procedure diagnosis: same Indication: cerumen impaction; given patient's otologic complaints and history as well as for improved and comprehensive examination of external ear and tympanic membrane, bilateral otologic examination using microscope was performed and impacted cerumen removed  Procedure: Patient was placed semi-recumbent. Both ear canals were examined using the microscope with findings above. Cerumen removed on left and on right using suction and currette with improvement in EAC examination and patency. Left: EAC was patent. TM was intact . Middle ear was aerated. Drainage: none Right: EAC was patent. TM was intact. Middle ear was aerated. Drainage: none Patient tolerated the procedure well.  The left external auditory canal with some narrowing and swelling, no significant erythema.        Impression & Plans:  Kevin Montoya is a 68 y.o. male with the following   Cerumen impaction-  The patient presented today with cerumen impaction.  This was removed without difficulty.  His left EAC is quite narrow with some swelling, I would like him to use Cortisporin, and like to see him back in the office in 7 to 10 days for repeat evaluation or sooner as needed.  He verbalized understanding and agreement to today's plan.   - f/u 7-10 days    Thank you for allowing me the opportunity to care for your patient. Please do not hesitate to contact me should you have any other questions.  Sincerely, Burna Forts PA-C [facility] Phone: [phone] Fax: [phone]  08/22/2023, 1:37 PM

## 2023-08-29 ENCOUNTER — Ambulatory Visit (INDEPENDENT_AMBULATORY_CARE_PROVIDER_SITE_OTHER)

## 2023-09-03 ENCOUNTER — Ambulatory Visit (INDEPENDENT_AMBULATORY_CARE_PROVIDER_SITE_OTHER)

## 2023-09-05 ENCOUNTER — Encounter (INDEPENDENT_AMBULATORY_CARE_PROVIDER_SITE_OTHER): Payer: Self-pay

## 2023-09-05 ENCOUNTER — Ambulatory Visit (INDEPENDENT_AMBULATORY_CARE_PROVIDER_SITE_OTHER)

## 2023-09-05 VITALS — BP 160/100 | HR 77

## 2023-09-05 DIAGNOSIS — H6123 Impacted cerumen, bilateral: Secondary | ICD-10-CM | POA: Diagnosis not present

## 2023-09-05 NOTE — Progress Notes (Signed)
 Dear Dr. Mamie Searles, Here is my assessment for our mutual patient, Kevin Montoya. Thank you for allowing me the opportunity to care for your patient. Please do not hesitate to contact me should you have any other questions. Sincerely, Belma Boxer PA-C  Otolaryngology Clinic Note Referring provider: Dr. Mamie Searles HPI:  Kevin Montoya is a 68 y.o. male kindly referred by Dr. Mamie Searles   The patient is a 37 YOM seen on our office for follow up evaluation for cerumen impaction. He was last seen in the office on 08/22/2023. Below is a recap of that encounter.    The patient is a 68 year old gentleman seen in our office for evaluation of bilateral cerumen impaction.  The patient is companied by his mother.  He notes that he has had decreased hearing over the last week secondary to cerumen.  He has had this previous.  He denies any baseline hearing issues.  He denies any pain or drainage.,  He denies any history of ear surgery, trauma, or recurrent ear infections.  Update 09/05/2023-  At the last office visit the cerumen was to impacted to be completely removed. He was placed on cortisporin HC. He notes that he has been using the drops and stopped last Saturday. He denies any pain, drainage, or hering changes.    Independent Review of Additional Tests or Records:   none   PMH/Meds/All/SocHx/FamHx/ROS:   Past Medical History:  Diagnosis Date   Blind    GERD (gastroesophageal reflux disease)    Hypertension    Left wrist fracture    PTSD (post-traumatic stress disorder)    related to medical procedures done as a child.    Severe needle phobia      Past Surgical History:  Procedure Laterality Date   CARPAL TUNNEL RELEASE Left 12/01/2019   Procedure: CARPAL TUNNEL RELEASE;  Surgeon: Wes Hamman, MD;  Location: [facility];  Service: Orthopedics;  Laterality: Left;   extra finger removal     as child   HARDWARE REMOVAL Left 08/13/2020   Procedure: HARDWARE REMOVAL LEFT WRIST;  Surgeon: Wes Hamman, MD;  Location: [facility];  Service: Orthopedics;  Laterality: Left;   OPEN REDUCTION INTERNAL FIXATION (ORIF) DISTAL RADIAL FRACTURE Left 12/01/2019   Procedure: OPEN REDUCTION INTERNAL FIXATION (ORIF) LEFT DISTAL RADIUS FRACTURE;  Surgeon: Wes Hamman, MD;  Location: [facility];  Service: Orthopedics;  Laterality: Left;   ORIF RADIAL FRACTURE Left 08/13/2020   Procedure: OPEN REDUCTION INTERNAL FIXATION (ORIF) RADIAL FRACTURE;  Surgeon: Wes Hamman, MD;  Location: [facility];  Service: Orthopedics;  Laterality: Left;   ULNAR HEAD EXCISION Left 08/13/2020   Procedure: REVISION OPEN REDUCTION INTERNAL FIXATION (ORIF) LEFT RADIAL SHAFT FRACTURE WITH REMOVAL OF HARDWARE, RESECTION ULNAR HEAD;  Surgeon: Wes Hamman, MD;  Location: [facility];  Service: Orthopedics;  Laterality: Left;    History reviewed. No pertinent family history.   Social Connections: Not on file      Current Outpatient Medications:    amLODipine (NORVASC) 5 MG tablet, Take 5 mg by mouth daily., Disp: , Rfl:    Ascorbic Acid (VITAMIN C ) 500 MG CHEW, Chew 1 tablet (500 mg total) by mouth 2 (two) times daily., Disp: 84 tablet, Rfl: 0   Ascorbic Acid (VITAMIN C ) 500 MG CHEW, Chew 1 tablet (500 mg total) by mouth 2 (two) times daily., Disp: 84 tablet, Rfl: 0   calcium -vitamin D  (OSCAL WITH D) 500-200 MG-UNIT tablet,  Take 1 tablet by mouth 3 (three) times daily., Disp: 90 tablet, Rfl: 6   ibuprofen  (ADVIL ) 800 MG tablet, Take 1 tablet (800 mg total) by mouth every 8 (eight) hours as needed., Disp: 30 tablet, Rfl: 2   neomycin -polymyxin-hydrocortisone (CORTISPORIN) OTIC solution, Place 4 drops into the left ear 3 (three) times daily., Disp: 10 mL, Rfl: 0   omeprazole (PRILOSEC) 20 MG capsule, Take 20 mg by mouth daily., Disp: , Rfl:    ondansetron  (ZOFRAN ) 4 MG tablet, Take 1 tablet (4 mg total) by mouth every 8 (eight) hours as needed for nausea or vomiting.,  Disp: 40 tablet, Rfl: 0   oxyCODONE -acetaminophen  (PERCOCET) 5-325 MG tablet, Take 1/2-1 tab po tid prn pain, Disp: 30 tablet, Rfl: 0   oxyCODONE -acetaminophen  (PERCOCET) 5-325 MG tablet, Take 1-2 tablets by mouth every 8 (eight) hours as needed for severe pain., Disp: 30 tablet, Rfl: 0   Physical Exam:   BP (!) 160/100   Pulse 77   SpO2 96%   Pertinent Findings  CN II-XII intact Bilateral cerumen, right worse than left Weber 512: equal Rinne 512: AC > BC b/l  Anterior rhinoscopy: Septum midline;  No obviously palpable neck masses/lymphadenopathy/thyromegaly No respiratory distress or stridor  Seprately Identifiable Procedures:  Procedure: Bilateral ear microscopy and cerumen removal using microscope (CPT [id number]) - Mod 50 Pre-procedure diagnosis: bilateral cerumen impaction external auditory canals Post-procedure diagnosis: same Indication: bilateral cerumen impaction; given patient's otologic complaints and history as well as for improved and comprehensive examination of external ear and tympanic membrane, bilateral otologic examination using microscope was performed and impacted cerumen removed  Procedure: Patient was placed semi-recumbent. Both ear canals were examined using the microscope with findings above. Cerumen removed from bilateral external auditory canals using suction and currette with improvement in EAC examination and patency. Left: EAC was patent. TM was intact . Middle ear was aerated. Drainage: none Right: EAC was patent. TM was intact . Middle ear was aerated . Drainage: none Patient tolerated the procedure well.   Impression & Plans:  Kevin Montoya is a 68 y.o. male with the following   Cerumen impaction-  The patient presented today with cerumen impaction.  This was removed without difficulty.  I see no signs of infection.  The patient will reach out to the office if she develops any new or worsening signs or symptoms.  She does not feel she has any significant  change to her baseline hearing and did not elect for audiology evaluation today.    - f/u PRN   Thank you for allowing me the opportunity to care for your patient. Please do not hesitate to contact me should you have any other questions.  Sincerely, Belma Boxer PA-C [facility] Phone: [phone] Fax: [phone]  09/05/2023, 2:41 PM

## 2024-02-25 DIAGNOSIS — Z1339 Encounter for screening examination for other mental health and behavioral disorders: Secondary | ICD-10-CM | POA: Diagnosis not present

## 2024-02-25 DIAGNOSIS — H9193 Unspecified hearing loss, bilateral: Secondary | ICD-10-CM | POA: Diagnosis not present

## 2024-02-25 DIAGNOSIS — M25532 Pain in left wrist: Secondary | ICD-10-CM | POA: Diagnosis not present

## 2024-02-25 DIAGNOSIS — M7989 Other specified soft tissue disorders: Secondary | ICD-10-CM | POA: Diagnosis not present

## 2024-02-25 DIAGNOSIS — E785 Hyperlipidemia, unspecified: Secondary | ICD-10-CM | POA: Diagnosis not present

## 2024-02-25 DIAGNOSIS — N401 Enlarged prostate with lower urinary tract symptoms: Secondary | ICD-10-CM | POA: Diagnosis not present

## 2024-02-25 DIAGNOSIS — Z1331 Encounter for screening for depression: Secondary | ICD-10-CM | POA: Diagnosis not present

## 2024-02-25 DIAGNOSIS — H543 Unqualified visual loss, both eyes: Secondary | ICD-10-CM | POA: Diagnosis not present

## 2024-02-25 DIAGNOSIS — I10 Essential (primary) hypertension: Secondary | ICD-10-CM | POA: Diagnosis not present

## 2024-02-25 DIAGNOSIS — E663 Overweight: Secondary | ICD-10-CM | POA: Diagnosis not present

## 2024-02-25 DIAGNOSIS — I872 Venous insufficiency (chronic) (peripheral): Secondary | ICD-10-CM | POA: Diagnosis not present

## 2024-02-25 DIAGNOSIS — Z0000 Encounter for general adult medical examination without abnormal findings: Secondary | ICD-10-CM | POA: Diagnosis not present
